# Patient Record
Sex: Female | Born: 1980 | Race: Black or African American | Hispanic: No | Marital: Married | Smoking: Never smoker
Health system: Southern US, Community
[De-identification: ages and names within clinical notes are randomized; demographics above are authoritative.]

## PROBLEM LIST (undated history)

## (undated) ENCOUNTER — Inpatient Hospital Stay (HOSPITAL_COMMUNITY): Payer: Self-pay

## (undated) DIAGNOSIS — E079 Disorder of thyroid, unspecified: Secondary | ICD-10-CM

## (undated) DIAGNOSIS — K439 Ventral hernia without obstruction or gangrene: Secondary | ICD-10-CM

## (undated) DIAGNOSIS — R0989 Other specified symptoms and signs involving the circulatory and respiratory systems: Secondary | ICD-10-CM

## (undated) HISTORY — PX: HERNIA REPAIR: SHX51

## (undated) HISTORY — DX: Disorder of thyroid, unspecified: E07.9

## (undated) HISTORY — DX: Ventral hernia without obstruction or gangrene: K43.9

---

## 1998-06-25 ENCOUNTER — Encounter: Payer: Self-pay | Admitting: Emergency Medicine

## 1998-06-25 ENCOUNTER — Emergency Department (HOSPITAL_COMMUNITY): Admission: EM | Admit: 1998-06-25 | Discharge: 1998-06-25 | Payer: Self-pay | Admitting: Emergency Medicine

## 1999-02-02 ENCOUNTER — Other Ambulatory Visit: Admission: RE | Admit: 1999-02-02 | Discharge: 1999-02-02 | Payer: Self-pay | Admitting: Obstetrics and Gynecology

## 1999-05-07 HISTORY — PX: WISDOM TOOTH EXTRACTION: SHX21

## 2004-05-06 HISTORY — PX: EYE SURGERY: SHX253

## 2007-06-17 ENCOUNTER — Observation Stay: Payer: Self-pay | Admitting: Obstetrics and Gynecology

## 2007-07-02 ENCOUNTER — Observation Stay: Payer: Self-pay | Admitting: Obstetrics and Gynecology

## 2007-07-10 ENCOUNTER — Ambulatory Visit: Payer: Self-pay | Admitting: Unknown Physician Specialty

## 2007-08-13 ENCOUNTER — Observation Stay: Payer: Self-pay

## 2007-08-29 ENCOUNTER — Inpatient Hospital Stay: Payer: Self-pay | Admitting: Unknown Physician Specialty

## 2008-02-29 ENCOUNTER — Ambulatory Visit: Payer: Self-pay | Admitting: Obstetrics and Gynecology

## 2008-08-17 ENCOUNTER — Ambulatory Visit: Payer: Self-pay | Admitting: Unknown Physician Specialty

## 2011-09-04 ENCOUNTER — Telehealth (INDEPENDENT_AMBULATORY_CARE_PROVIDER_SITE_OTHER): Payer: Self-pay | Admitting: Surgery

## 2011-09-04 ENCOUNTER — Ambulatory Visit (INDEPENDENT_AMBULATORY_CARE_PROVIDER_SITE_OTHER): Payer: BC Managed Care – PPO | Admitting: Surgery

## 2011-09-04 ENCOUNTER — Encounter (INDEPENDENT_AMBULATORY_CARE_PROVIDER_SITE_OTHER): Payer: Self-pay | Admitting: General Surgery

## 2011-09-04 ENCOUNTER — Encounter (INDEPENDENT_AMBULATORY_CARE_PROVIDER_SITE_OTHER): Payer: Self-pay | Admitting: Surgery

## 2011-09-04 VITALS — BP 124/68 | HR 68 | Temp 97.3°F | Resp 14 | Ht 65.0 in | Wt 178.4 lb

## 2011-09-04 DIAGNOSIS — K439 Ventral hernia without obstruction or gangrene: Secondary | ICD-10-CM

## 2011-09-04 HISTORY — DX: Ventral hernia without obstruction or gangrene: K43.9

## 2011-09-04 NOTE — Progress Notes (Signed)
Patient ID: Dana [name], [name]   DOB: 1980-11-12, 31 y.o.   MRN: [id number]  Chief Complaint  Patient presents with  . Hernia    HPI Dana [name] is a 31 y.o. [name].  Self-referred for evaluation of ventral hernia HPI This is a Dana 31 yo [name] who presents with a palpable mass above her umbilicus for the last couple of years.  In the last 6 months, this mass has become larger.  Over the weekend, it became quite large and very painful.  The pain improved when she was supine.  The mass has gone back down in size and is less uncomfortable.  She presents now for surgical evaluation.  Past Medical History  Diagnosis Date  . Hernia   . Chronic constipation   . Blood in stool   . Ventral hernia 09/04/2011    Past Surgical History  Procedure Date  . Wisdom tooth extraction 2001  . Eye surgery 2006    lasik - both eyes    Family History  Problem Relation Age of Onset  . Cancer Maternal Grandmother     lung    Social History History  Substance Use Topics  . Smoking status: Never Smoker   . Smokeless tobacco: Never Used  . Alcohol Use: No    No Known Allergies  Current Outpatient Prescriptions  Medication Sig Dispense Refill  . docusate sodium (COLACE) 100 MG capsule Take 100 mg by mouth 2 (two) times daily.      Marland Kitchen levocetirizine (XYZAL) 5 MG tablet 5 mg every evening.         Review of Systems Review of Systems  Constitutional: Negative for fever, chills and unexpected weight change.  HENT: Negative for hearing loss, congestion, sore throat, trouble swallowing and voice change.   Eyes: Negative for visual disturbance.  Respiratory: Negative for cough and wheezing.   Cardiovascular: Negative for chest pain, palpitations and leg swelling.  Gastrointestinal: Positive for abdominal pain and constipation. Negative for nausea, vomiting, diarrhea, blood in stool, abdominal distention and anal bleeding.  Genitourinary: Negative for hematuria, vaginal bleeding  and difficulty urinating.  Musculoskeletal: Negative for arthralgias.  Skin: Negative for rash and wound.  Neurological: Negative for seizures, syncope and headaches.  Hematological: Negative for adenopathy. Does not bruise/bleed easily.  Psychiatric/Behavioral: Negative for confusion.    Blood pressure 124/68, pulse 68, temperature 97.3 F (36.3 C), temperature source Temporal, resp. rate 14, height 5\' 5"  (1.651 m), weight 178 lb 6 oz (80.91 kg).  Physical Exam Physical Exam WDWN in NAD HEENT:  EOMI, sclera anicteric Neck:  No masses, no thyromegaly Lungs:  CTA bilaterally; normal respiratory effort CV:  Regular rate and rhythm; no murmurs Abd:  +bowel sounds, soft, palpable 4 cm mass above the umbilicus in the midline; partially reducible Ext:  Well-perfused; no edema Skin:  Warm, dry; no sign of jaundice  Data Reviewed none  Assessment    Ventral hernia - supraumbilical with probable incarcerated omentum    Plan    Ventral hernia repair with mesh.  The surgical procedure has been discussed with the patient.  Potential risks, benefits, alternative treatments, and expected outcomes have been explained.  All of the patient's questions at this time have been answered.  The likelihood of reaching the patient's treatment goal is good.  The patient understand the proposed surgical procedure and wishes to proceed.        Zaylin Pistilli K. 09/04/2011, 11:56 AM

## 2011-09-10 ENCOUNTER — Encounter (HOSPITAL_BASED_OUTPATIENT_CLINIC_OR_DEPARTMENT_OTHER): Payer: Self-pay | Admitting: *Deleted

## 2011-09-13 ENCOUNTER — Encounter (HOSPITAL_BASED_OUTPATIENT_CLINIC_OR_DEPARTMENT_OTHER): Payer: Self-pay | Admitting: Anesthesiology

## 2011-09-13 ENCOUNTER — Ambulatory Visit (HOSPITAL_BASED_OUTPATIENT_CLINIC_OR_DEPARTMENT_OTHER)
Admission: RE | Admit: 2011-09-13 | Discharge: 2011-09-13 | Disposition: A | Discharge status: Home / Self Care | Payer: BC Managed Care – PPO | Source: Ambulatory Visit | Attending: Surgery | Admitting: Surgery

## 2011-09-13 ENCOUNTER — Ambulatory Visit (HOSPITAL_BASED_OUTPATIENT_CLINIC_OR_DEPARTMENT_OTHER): Payer: BC Managed Care – PPO | Admitting: Anesthesiology

## 2011-09-13 ENCOUNTER — Encounter (HOSPITAL_BASED_OUTPATIENT_CLINIC_OR_DEPARTMENT_OTHER): Payer: Self-pay | Admitting: *Deleted

## 2011-09-13 ENCOUNTER — Encounter (HOSPITAL_BASED_OUTPATIENT_CLINIC_OR_DEPARTMENT_OTHER)
Admission: RE | Disposition: A | Discharge status: Home / Self Care | Payer: Self-pay | Source: Ambulatory Visit | Attending: Surgery

## 2011-09-13 DIAGNOSIS — K439 Ventral hernia without obstruction or gangrene: Secondary | ICD-10-CM

## 2011-09-13 HISTORY — PX: VENTRAL HERNIA REPAIR: SHX424

## 2011-09-13 LAB — POCT HEMOGLOBIN-HEMACUE: Hemoglobin: 13.5 g/dL (ref 12.0–15.0)

## 2011-09-13 SURGERY — REPAIR, HERNIA, VENTRAL
Anesthesia: General | Site: Abdomen | Wound class: Clean

## 2011-09-13 MED ORDER — BUPIVACAINE-EPINEPHRINE 0.25% -1:200000 IJ SOLN
INTRAMUSCULAR | Status: DC | PRN
  Administered 2011-09-13: 10 mL

## 2011-09-13 MED ORDER — PROPOFOL 10 MG/ML IV EMUL
INTRAVENOUS | Status: DC | PRN
  Administered 2011-09-13: 200 mg via INTRAVENOUS

## 2011-09-13 MED ORDER — CEFAZOLIN SODIUM-DEXTROSE 2-3 GM-% IV SOLR: 2.0000 g | INTRAVENOUS | Status: DC

## 2011-09-13 MED ORDER — KETOROLAC TROMETHAMINE 30 MG/ML IJ SOLN
30.0000 mg | Freq: Once | INTRAMUSCULAR | Status: AC
  Administered 2011-09-13: 30 mg via INTRAVENOUS

## 2011-09-13 MED ORDER — OXYCODONE-ACETAMINOPHEN 5-325 MG PO TABS: 1.0000 | ORAL_TABLET | ORAL | Status: AC | PRN

## 2011-09-13 MED ORDER — LIDOCAINE HCL (CARDIAC) 20 MG/ML IV SOLN
INTRAVENOUS | Status: DC | PRN
  Administered 2011-09-13: 80 mg via INTRAVENOUS

## 2011-09-13 MED ORDER — MORPHINE SULFATE 2 MG/ML IJ SOLN: 2.0000 mg | INTRAMUSCULAR | Status: DC | PRN

## 2011-09-13 MED ORDER — ONDANSETRON HCL 4 MG/2ML IJ SOLN: 4.0000 mg | Freq: Four times a day (QID) | INTRAMUSCULAR | Status: DC | PRN

## 2011-09-13 MED ORDER — HYDROMORPHONE HCL PF 1 MG/ML IJ SOLN
0.2500 mg | INTRAMUSCULAR | Status: DC | PRN
  Administered 2011-09-13: 0.5 mg via INTRAVENOUS

## 2011-09-13 MED ORDER — LACTATED RINGERS IV SOLN
INTRAVENOUS | Status: DC
  Administered 2011-09-13 (×2): via INTRAVENOUS

## 2011-09-13 MED ORDER — ONDANSETRON HCL 4 MG/2ML IJ SOLN
INTRAMUSCULAR | Status: DC | PRN
  Administered 2011-09-13: 4 mg via INTRAVENOUS

## 2011-09-13 MED ORDER — OXYCODONE-ACETAMINOPHEN 5-325 MG PO TABS: 1.0000 | ORAL_TABLET | ORAL | Status: DC | PRN

## 2011-09-13 MED ORDER — DEXAMETHASONE SODIUM PHOSPHATE 4 MG/ML IJ SOLN
INTRAMUSCULAR | Status: DC | PRN
  Administered 2011-09-13: 10 mg via INTRAVENOUS

## 2011-09-13 MED ORDER — ONDANSETRON HCL 4 MG/2ML IJ SOLN: 4.0000 mg | INTRAMUSCULAR | Status: DC | PRN

## 2011-09-13 MED ORDER — FENTANYL CITRATE 0.05 MG/ML IJ SOLN
INTRAMUSCULAR | Status: DC | PRN
  Administered 2011-09-13 (×2): 50 ug via INTRAVENOUS

## 2011-09-13 SURGICAL SUPPLY — 51 items
BENZOIN TINCTURE PRP APPL 2/3 (GAUZE/BANDAGES/DRESSINGS) ×2 IMPLANT
BLADE SURG 10 STRL SS (BLADE) ×2 IMPLANT
BLADE SURG 15 STRL LF DISP TIS (BLADE) ×1 IMPLANT
BLADE SURG 15 STRL SS (BLADE) ×1
BLADE SURG ROTATE 9660 (MISCELLANEOUS) ×2 IMPLANT
CANISTER SUCTION 1200CC (MISCELLANEOUS) ×2 IMPLANT
CHLORAPREP W/TINT 26ML (MISCELLANEOUS) ×2 IMPLANT
CLEANER CAUTERY TIP 5X5 PAD (MISCELLANEOUS) ×1 IMPLANT
CLOTH BEACON ORANGE TIMEOUT ST (SAFETY) ×2 IMPLANT
COVER MAYO STAND STRL (DRAPES) ×2 IMPLANT
COVER TABLE BACK 60X90 (DRAPES) ×2 IMPLANT
DECANTER SPIKE VIAL GLASS SM (MISCELLANEOUS) ×2 IMPLANT
DRAPE LAPAROTOMY T 102X78X121 (DRAPES) ×2 IMPLANT
DRAPE UTILITY XL STRL (DRAPES) ×2 IMPLANT
DRSG TEGADERM 4X4.75 (GAUZE/BANDAGES/DRESSINGS) ×2 IMPLANT
ELECT REM PT RETURN 9FT ADLT (ELECTROSURGICAL) ×2
ELECTRODE REM PT RTRN 9FT ADLT (ELECTROSURGICAL) ×1 IMPLANT
GAUZE SPONGE 4X4 12PLY STRL LF (GAUZE/BANDAGES/DRESSINGS) ×4 IMPLANT
GLOVE BIO SURGEON STRL SZ 6.5 (GLOVE) ×2 IMPLANT
GLOVE BIO SURGEON STRL SZ7 (GLOVE) ×2 IMPLANT
GLOVE BIOGEL PI IND STRL 7.5 (GLOVE) ×1 IMPLANT
GLOVE BIOGEL PI INDICATOR 7.5 (GLOVE) ×1
GOWN PREVENTION PLUS XLARGE (GOWN DISPOSABLE) ×4 IMPLANT
NEEDLE HYPO 25X1 1.5 SAFETY (NEEDLE) ×2 IMPLANT
NEEDLE SPNL 25GX3.5 QUINCKE BL (NEEDLE) IMPLANT
NS IRRIG 1000ML POUR BTL (IV SOLUTION) ×2 IMPLANT
PACK BASIN DAY SURGERY FS (CUSTOM PROCEDURE TRAY) ×2 IMPLANT
PAD CLEANER CAUTERY TIP 5X5 (MISCELLANEOUS) ×1
PATCH VENTRAL SMALL 4.3 (Mesh Specialty) ×2 IMPLANT
PENCIL BUTTON HOLSTER BLD 10FT (ELECTRODE) ×2 IMPLANT
SLEEVE SCD COMPRESS KNEE MED (MISCELLANEOUS) ×2 IMPLANT
SPONGE LAP 18X18 X RAY DECT (DISPOSABLE) ×2 IMPLANT
STAPLER VISISTAT 35W (STAPLE) IMPLANT
STRIP CLOSURE SKIN 1/2X4 (GAUZE/BANDAGES/DRESSINGS) ×2 IMPLANT
SUT MNCRL AB 4-0 PS2 18 (SUTURE) ×2 IMPLANT
SUT NOVA NAB DX-16 0-1 5-0 T12 (SUTURE) ×2 IMPLANT
SUT NOVA NAB GS-21 0 18 T12 DT (SUTURE) ×2 IMPLANT
SUT PROLENE 0 CT 1 CR/8 (SUTURE) IMPLANT
SUT PROLENE 2 0 CT2 30 (SUTURE) IMPLANT
SUT SILK 2 0 TIES 17X18 (SUTURE)
SUT SILK 2-0 18XBRD TIE BLK (SUTURE) IMPLANT
SUT SURG 0 T 19/GS 22 1969 62 (SUTURE) IMPLANT
SUT VIC AB 3-0 SH 27 (SUTURE) ×1
SUT VIC AB 3-0 SH 27X BRD (SUTURE) ×1 IMPLANT
SYR BULB 3OZ (MISCELLANEOUS) IMPLANT
SYR CONTROL 10ML LL (SYRINGE) ×2 IMPLANT
TOWEL OR 17X24 6PK STRL BLUE (TOWEL DISPOSABLE) ×2 IMPLANT
TOWEL OR NON WOVEN STRL DISP B (DISPOSABLE) ×2 IMPLANT
TUBE CONNECTING 20X1/4 (TUBING) ×2 IMPLANT
WATER STERILE IRR 1000ML POUR (IV SOLUTION) IMPLANT
YANKAUER SUCT BULB TIP NO VENT (SUCTIONS) ×2 IMPLANT

## 2011-09-13 NOTE — Interval H&P Note (Signed)
History and Physical Interval Note:  09/13/2011 7:24 AM  Dana [name]  has presented today for surgery, with the diagnosis of ventral hernia  The various methods of treatment have been discussed with the patient and family. After consideration of risks, benefits and other options for treatment, the patient has consented to  Procedure(s) (LRB): HERNIA REPAIR VENTRAL ADULT (N/A) INSERTION OF MESH (N/A) as a surgical intervention .  The patients' history has been reviewed, patient examined, no change in status, stable for surgery.  I have reviewed the patients' chart and labs.  Questions were answered to the patient's satisfaction.     [name]

## 2011-09-13 NOTE — Anesthesia Preprocedure Evaluation (Signed)
Anesthesia Evaluation  Patient identified by MRN, date of birth, ID band Patient awake    Reviewed: Allergy & Precautions, H&P , NPO status , Patient's Chart, lab work & pertinent test results  Airway Mallampati: II  Neck ROM: full    Dental   Pulmonary          Cardiovascular     Neuro/Psych    GI/Hepatic   Endo/Other    Renal/GU      Musculoskeletal   Abdominal   Peds  Hematology   Anesthesia Other Findings   Reproductive/Obstetrics                           Anesthesia Physical Anesthesia Plan  ASA: I  Anesthesia Plan: General   Post-op Pain Management:    Induction: Intravenous  Airway Management Planned: Oral ETT  Additional Equipment:   Intra-op Plan:   Post-operative Plan: Extubation in OR  Informed Consent: I have reviewed the patients History and Physical, chart, labs and discussed the procedure including the risks, benefits and alternatives for the proposed anesthesia with the patient or authorized representative who has indicated his/her understanding and acceptance.     Plan Discussed with: CRNA and Surgeon  Anesthesia Plan Comments:         Anesthesia Quick Evaluation  

## 2011-09-13 NOTE — Op Note (Signed)
Indications:  The patient presented with a history of a supraumbilical ventral hernia.  The patient was examined and we recommended ventrall hernia repair with mesh.  Pre-operative diagnosis: Ventral hernia  Post-operative diagnosis:  Same  Surgeon: Melchizedek Espinola K.   Assistants: none  Anesthesia: General LMA anesthesia  ASA Class: 2   Procedure Details  The patient was seen again in the Holding Room. The risks, benefits, complications, treatment options, and expected outcomes were discussed with the patient. The possibilities of reaction to medication, pulmonary aspiration, perforation of viscus, bleeding, recurrent infection, the need for additional procedures, and development of a complication requiring transfusion or further operation were discussed with the patient and/or family. There was concurrence with the proposed plan, and informed consent was obtained. The site of surgery was properly noted/marked. The patient was taken to the Operating Room, identified as Dana [name], and the procedure verified as umbilical hernia repair. A Time Out was held and the above information confirmed.  After an adequate level of general anesthesia was obtained, the patient's abdomen was prepped with Chloraprep and draped in sterile fashion.  We made a vertical incision about 4 cm long above the umbilicus.  Dissection was carried down to the hernia sac with cautery.  We dissected bluntly around the hernia sac down to the edge of the fascial defect.  We reduced the hernia sac back into the pre-peritoneal space.  The fascial defect measured 1.5 cm across.  We cleared the fascia in all directions.  A small Proceed ventral patch was inserted into the pre-peritoneal space and was deployed.  The mesh was secured with four trans-fascial sutures of 0 Novofil.  The fascial defect was closed with multiple interrupted figure-of-eight 1 Novofil sutures.  3-0 Vicryl was used to close the subcutaneous tissues and  4-0 Monocryl was used to close the skin.  Steri-strips and clean dressing were applied.  The patient was extubated and brought to the recovery room in stable condition.  All sponge, instrument, and needle counts were correct prior to closure and at the conclusion of the case.   Estimated Blood Loss: Minimal          Complications: None; patient tolerated the procedure well.         Disposition: PACU - hemodynamically stable.         Condition: stable  [name]. [name], MD, [facility]  09/13/2011 8:33 AM

## 2011-09-13 NOTE — Anesthesia Postprocedure Evaluation (Signed)
Anesthesia Post Note  Patient: Firefighter  Procedure(s) Performed: Procedure(s) (LRB): HERNIA REPAIR VENTRAL ADULT (N/A) INSERTION OF MESH (N/A)  Anesthesia type: General  Patient location: PACU  Post pain: Pain level controlled and Adequate analgesia  Post assessment: Post-op Vital signs reviewed, Patient's Cardiovascular Status Stable, Respiratory Function Stable, Patent Airway and Pain level controlled  Last Vitals:  Filed Vitals:   09/13/11 0915  BP: 116/78  Pulse: 92  Temp:   Resp: 23    Post vital signs: Reviewed and stable  Level of consciousness: awake, alert  and oriented  Complications: No apparent anesthesia complications

## 2011-09-13 NOTE — Discharge Instructions (Signed)
[facility], [place]  VENTRAL HERNIA REPAIR: POST OP INSTRUCTIONS  Always review your discharge instruction sheet given to you by the facility where your surgery was performed. IF YOU HAVE DISABILITY OR FAMILY LEAVE FORMS, YOU MUST BRING THEM TO THE OFFICE FOR PROCESSING.   DO NOT GIVE THEM TO YOUR DOCTOR.  1. A  prescription for pain medication may be given to you upon discharge.  Take your pain medication as prescribed, if needed.  If narcotic pain medicine is not needed, then you may take acetaminophen (Tylenol) or ibuprofen (Advil) as needed. 2. Take your usually prescribed medications unless otherwise directed. 3. If you need a refill on your pain medication, please contact your pharmacy.  They will contact our office to request authorization. Prescriptions will not be filled after 5 pm or on week-ends. 4. You should follow a light diet the first 24 hours after arrival home, such as soup and crackers, etc.  Be sure to include lots of fluids daily.  Resume your normal diet the day after surgery. 5. Most patients will experience some swelling and bruising around the incision.  Ice packs and reclining will help.  Swelling and bruising can take several days to resolve.  6. It is common to experience some constipation if taking pain medication after surgery.  Increasing fluid intake and taking a stool softener (such as Colace) will usually help or prevent this problem from occurring.  A mild laxative (Milk of Magnesia or Miralax) should be taken according to package directions if there are no bowel movements after 48 hours. 7. Unless discharge instructions indicate otherwise, you may remove your bandages 24-48 hours after surgery, and you may shower at that time.  You will have steri-strips (small skin tapes) in place directly over the incision.  These strips should be left on the skin for 7-10 days. 8. ACTIVITIES:  You may resume regular (light) daily activities beginning the next day--such  as daily self-care, walking, climbing stairs--gradually increasing activities as tolerated.  You may have sexual intercourse when it is comfortable.  Refrain from any heavy lifting or straining until approved by your doctor. a. You may drive when you are no longer taking prescription pain medication, you can comfortably wear a seatbelt, and you can safely maneuver your car and apply brakes. b. RETURN TO WORK:  2-3 weeks with light duty - no lifting over 15 lbs. 9. You should see your doctor in the office for a follow-up appointment approximately 2-3 weeks after your surgery.  Make sure that you call for this appointment within a day or two after you arrive home to insure a convenient appointment time. 10. OTHER INSTRUCTIONS:  __________________________________________________________________________________________________________________________________________________________________________________________  WHEN TO CALL YOUR DOCTOR: 1. Fever over 101.0 2. Inability to urinate 3. Nausea and/or vomiting 4. Extreme swelling or bruising 5. Continued bleeding from incision. 6. Increased pain, redness, or drainage from the incision  The clinic staff is available to answer your questions during regular business hours.  Please don't hesitate to call and ask to speak to one of the nurses for clinical concerns.  If you have a medical emergency, go to the nearest emergency room or call 911.  A surgeon from [facility] is always on call at the hospital   [address], [place], [place]  [address] ?  [address] [address], [place], [place]   [address] [phone]    FAX [phone] Web site: [email or web address]   Post Anesthesia Home Care  Instructions  Activity: Get plenty of rest for the remainder of the day. A responsible adult should stay with you for 24 hours following the procedure.  For the next 24 hours, DO NOT: -Drive a car -Social worker -Drink alcoholic beverages -Take any medication unless instructed by your physician -Make any legal decisions or sign important papers.  Meals: Start with liquid foods such as gelatin or soup. Progress to regular foods as tolerated. Avoid greasy, spicy, heavy foods. If nausea and/or vomiting occur, drink only clear liquids until the nausea and/or vomiting subsides. Call your physician if vomiting continues.  Special Instructions/Symptoms: Your throat may feel dry or sore from the anesthesia or the breathing tube placed in your throat during surgery. If this causes discomfort, gargle with warm salt water. The discomfort should disappear within 24 hours.

## 2011-09-13 NOTE — H&P (View-Only) (Signed)
Patient ID: Dana [name], female   DOB: 12/03/1980, 31 y.o.   MRN: [id number]  Chief Complaint  Patient presents with  . Hernia    HPI Dana [name] is a 31 y.o. female.  Self-referred for evaluation of ventral hernia HPI This is a health 31 yo female who presents with a palpable mass above her umbilicus for the last couple of years.  In the last 6 months, this mass has become larger.  Over the weekend, it became quite large and very painful.  The pain improved when she was supine.  The mass has gone back down in size and is less uncomfortable.  She presents now for surgical evaluation.  Past Medical History  Diagnosis Date  . Hernia   . Chronic constipation   . Blood in stool   . Ventral hernia 09/04/2011    Past Surgical History  Procedure Date  . Wisdom tooth extraction 2001  . Eye surgery 2006    lasik - both eyes    Family History  Problem Relation Age of Onset  . Cancer Maternal Grandmother     lung    Social History History  Substance Use Topics  . Smoking status: Never Smoker   . Smokeless tobacco: Never Used  . Alcohol Use: No    No Known Allergies  Current Outpatient Prescriptions  Medication Sig Dispense Refill  . docusate sodium (COLACE) 100 MG capsule Take 100 mg by mouth 2 (two) times daily.      . levocetirizine (XYZAL) 5 MG tablet 5 mg every evening.         Review of Systems Review of Systems  Constitutional: Negative for fever, chills and unexpected weight change.  HENT: Negative for hearing loss, congestion, sore throat, trouble swallowing and voice change.   Eyes: Negative for visual disturbance.  Respiratory: Negative for cough and wheezing.   Cardiovascular: Negative for chest pain, palpitations and leg swelling.  Gastrointestinal: Positive for abdominal pain and constipation. Negative for nausea, vomiting, diarrhea, blood in stool, abdominal distention and anal bleeding.  Genitourinary: Negative for hematuria, vaginal bleeding  and difficulty urinating.  Musculoskeletal: Negative for arthralgias.  Skin: Negative for rash and wound.  Neurological: Negative for seizures, syncope and headaches.  Hematological: Negative for adenopathy. Does not bruise/bleed easily.  Psychiatric/Behavioral: Negative for confusion.    Blood pressure 124/68, pulse 68, temperature 97.3 F (36.3 C), temperature source Temporal, resp. rate 14, height 5' 5" (1.651 m), weight 178 lb 6 oz (80.91 kg).  Physical Exam Physical Exam WDWN in NAD HEENT:  EOMI, sclera anicteric Neck:  No masses, no thyromegaly Lungs:  CTA bilaterally; normal respiratory effort CV:  Regular rate and rhythm; no murmurs Abd:  +bowel sounds, soft, palpable 4 cm mass above the umbilicus in the midline; partially reducible Ext:  Well-perfused; no edema Skin:  Warm, dry; no sign of jaundice  Data Reviewed none  Assessment    Ventral hernia - supraumbilical with probable incarcerated omentum    Plan    Ventral hernia repair with mesh.  The surgical procedure has been discussed with the patient.  Potential risks, benefits, alternative treatments, and expected outcomes have been explained.  All of the patient's questions at this time have been answered.  The likelihood of reaching the patient's treatment goal is good.  The patient understand the proposed surgical procedure and wishes to proceed.        Tyler Cubit K. 09/04/2011, 11:56 AM    

## 2011-09-13 NOTE — Transfer of Care (Signed)
Immediate Anesthesia Transfer of Care Note  Patient: Dana [name]  Procedure(s) Performed: Procedure(s) (LRB): HERNIA REPAIR VENTRAL ADULT (N/A) INSERTION OF MESH (N/A)  Patient Location: PACU  Anesthesia Type: General  Level of Consciousness: sedated  Airway & Oxygen Therapy: Patient Spontanous Breathing and Patient connected to face mask oxygen  Post-op Assessment: Report given to PACU RN and Post -op Vital signs reviewed and stable  Post vital signs: Reviewed and stable  Complications: No apparent anesthesia complications

## 2011-09-13 NOTE — Anesthesia Procedure Notes (Signed)
Procedure Name: LMA Insertion Date/Time: 09/13/2011 7:45 AM Performed by: Burna Cash Pre-anesthesia Checklist: Patient identified, Emergency Drugs available, Suction available and Patient being monitored Patient Re-evaluated:Patient Re-evaluated prior to inductionOxygen Delivery Method: Circle System Utilized Preoxygenation: Pre-oxygenation with 100% oxygen Intubation Type: IV induction Ventilation: Mask ventilation without difficulty LMA: LMA inserted LMA Size: 4.0 Number of attempts: 1 Airway Equipment and Method: bite block Placement Confirmation: positive ETCO2 Tube secured with: Tape Dental Injury: Teeth and Oropharynx as per pre-operative assessment

## 2011-09-13 NOTE — Addendum Note (Signed)
Addendum  created 09/13/11 1226 by Burna Cash, CRNA   Modules edited:Anesthesia Events

## 2011-09-16 ENCOUNTER — Encounter (HOSPITAL_BASED_OUTPATIENT_CLINIC_OR_DEPARTMENT_OTHER): Payer: Self-pay | Admitting: Surgery

## 2011-09-18 ENCOUNTER — Telehealth (INDEPENDENT_AMBULATORY_CARE_PROVIDER_SITE_OTHER): Payer: Self-pay | Admitting: General Surgery

## 2011-09-18 NOTE — Telephone Encounter (Signed)
Patient works for [employer] at [employer] job. Calling post hernia repair inquiring about a return to work date. She states her forms say 09/27/11 which would be 2 weeks. She was asking if she could go back sooner. 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 She will bring her forms here to be filled out. I advised to write a note on them to let our employee who fills these forms out know of her intended return to work date. She will call with any other questions.

## 2011-09-27 ENCOUNTER — Encounter (INDEPENDENT_AMBULATORY_CARE_PROVIDER_SITE_OTHER): Payer: Self-pay | Admitting: Surgery

## 2011-09-27 ENCOUNTER — Ambulatory Visit (INDEPENDENT_AMBULATORY_CARE_PROVIDER_SITE_OTHER): Payer: BC Managed Care – PPO | Admitting: Surgery

## 2011-09-27 VITALS — BP 100/80 | HR 80 | Temp 98.3°F | Resp 14 | Ht 65.0 in | Wt 179.8 lb

## 2011-09-27 DIAGNOSIS — K439 Ventral hernia without obstruction or gangrene: Secondary | ICD-10-CM

## 2011-09-27 NOTE — Progress Notes (Signed)
S/p open ventral hernia repair with mesh on 09/13/11 for a 1.5 cm supraumbilical ventral hernia. We repaired this hernia with a small proceed ventral patch. The patient reports no pain. Her incision is well healed with no sign of infection. No sign of recurrent hernia. No seroma. Steri-Strips are removed.  The patient may begin increasing her level of activity. Followup p.r.n.  [name]. [name], MD, [facility]  09/27/2011 5:45 PM

## 2013-07-20 ENCOUNTER — Ambulatory Visit (INDEPENDENT_AMBULATORY_CARE_PROVIDER_SITE_OTHER): Payer: BC Managed Care – PPO | Admitting: Family Medicine

## 2013-07-20 VITALS — BP 110/76 | HR 92 | Temp 98.3°F | Resp 16 | Ht 65.0 in | Wt 191.8 lb

## 2013-07-20 DIAGNOSIS — N390 Urinary tract infection, site not specified: Secondary | ICD-10-CM

## 2013-07-20 DIAGNOSIS — R350 Frequency of micturition: Secondary | ICD-10-CM

## 2013-07-20 LAB — POCT URINE PREGNANCY: Preg Test, Ur: NEGATIVE

## 2013-07-20 LAB — POCT URINALYSIS DIPSTICK
Bilirubin, UA: NEGATIVE
Glucose, UA: NEGATIVE
Ketones, UA: 15
Nitrite, UA: NEGATIVE
Protein, UA: 100
Spec Grav, UA: 1.025
Urobilinogen, UA: 1
pH, UA: 6.5

## 2013-07-20 LAB — POCT UA - MICROSCOPIC ONLY
Casts, Ur, LPF, POC: NEGATIVE
Crystals, Ur, HPF, POC: NEGATIVE
Yeast, UA: NEGATIVE

## 2013-07-20 MED ORDER — NITROFURANTOIN MONOHYD MACRO 100 MG PO CAPS: 100.0000 mg | ORAL_CAPSULE | Freq: Two times a day (BID) | ORAL | Status: DC

## 2013-07-20 NOTE — Patient Instructions (Signed)
Urinary Tract Infection  Urinary tract infections (UTIs) can develop anywhere along your urinary tract. Your urinary tract is your body's drainage system for removing wastes and extra water. Your urinary tract includes two kidneys, two ureters, a bladder, and a urethra. Your kidneys are a pair of bean-shaped organs. Each kidney is about the size of your fist. They are located below your ribs, one on each side of your spine.  CAUSES  Infections are caused by microbes, which are microscopic organisms, including fungi, viruses, and bacteria. These organisms are so small that they can only be seen through a microscope. Bacteria are the microbes that most commonly cause UTIs.  SYMPTOMS   Symptoms of UTIs may vary by age and gender of the patient and by the location of the infection. Symptoms in young women typically include a frequent and intense urge to urinate and a painful, burning feeling in the bladder or urethra during urination. Older women and men are more likely to be tired, shaky, and weak and have muscle aches and abdominal pain. A fever may mean the infection is in your kidneys. Other symptoms of a kidney infection include pain in your back or sides below the ribs, nausea, and vomiting.  DIAGNOSIS  To diagnose a UTI, your caregiver will ask you about your symptoms. Your caregiver also will ask to provide a urine sample. The urine sample will be tested for bacteria and white blood cells. White blood cells are made by your body to help fight infection.  TREATMENT   Typically, UTIs can be treated with medication. Because most UTIs are caused by a bacterial infection, they usually can be treated with the use of antibiotics. The choice of antibiotic and length of treatment depend on your symptoms and the type of bacteria causing your infection.  HOME CARE INSTRUCTIONS   If you were prescribed antibiotics, take them exactly as your caregiver instructs you. Finish the medication even if you feel better after you  have only taken some of the medication.   Drink enough water and fluids to keep your urine clear or pale yellow.   Avoid caffeine, tea, and carbonated beverages. They tend to irritate your bladder.   Empty your bladder often. Avoid holding urine for long periods of time.   Empty your bladder before and after sexual intercourse.   After a bowel movement, women should cleanse from front to back. Use each tissue only once.  SEEK MEDICAL CARE IF:    You have back pain.   You develop a fever.   Your symptoms do not begin to resolve within 3 days.  SEEK IMMEDIATE MEDICAL CARE IF:    You have severe back pain or lower abdominal pain.   You develop chills.   You have nausea or vomiting.   You have continued burning or discomfort with urination.  MAKE SURE YOU:    Understand these instructions.   Will watch your condition.   Will get help right away if you are not doing well or get worse.  Document Released: 01/30/2005 Document Revised: 10/22/2011 Document Reviewed: 05/31/2011  [employer] Patient Information 2014 [employer]

## 2013-07-20 NOTE — Progress Notes (Signed)
Subjective:    Patient ID: Dana [name], female    DOB: 21-Mar-1981, 33 y.o.   MRN: [id number] This chart was scribed for Robyn Haber, MD by Anastasia Pall, ED Scribe. This patient was seen in room 11 and the patient's care was started at 2:11 PM.  Chief Complaint  Patient presents with   Increased frequency of urination    X yesterday   Discomfort with Urination    X yesterday   HPI Dana [name] is a 33 y.o. female Pt presents with urinary frequency, onset yesterday, with associated dysuria and abdominal cramping. She states her urine has had a strong odor to it today. She reports h/o UTI with her last pregnancy. She states her LNMP was beginning of 07/2013. She denies fever, back pain, discharge, and any other associated symptoms. Patient is in the process of trying to conceive.  PCP Dana Carls, MD  Patient Active Problem List   Diagnosis Date Noted   Ventral hernia 09/04/2011   Prior to Admission medications   Medication Sig Start Date End Date Taking? Authorizing Provider  Prenatal Vit-Fe Fumarate-FA (PRENATAL MULTIVITAMIN) TABS tablet Take 1 tablet by mouth daily at 12 noon.   Yes Historical Provider, MD  docusate sodium (COLACE) 100 MG capsule Take 100 mg by mouth daily.     Historical Provider, MD  levocetirizine (XYZAL) 5 MG tablet 5 mg every evening.  09/03/11   Historical Provider, MD   Review of Systems  Constitutional: Negative for fever.  Gastrointestinal: Positive for abdominal pain (cramping).  Genitourinary: Positive for dysuria and frequency. Negative for vaginal discharge.       + for strong urine odor  Musculoskeletal: Negative for back pain.    BP 110/76   Pulse 92   Temp(Src) 98.3 F (36.8 C) (Oral)   Resp 16   Ht 5\' 5"  (1.651 m)   Wt 191 lb 12.8 oz (87 kg)   BMI 31.92 kg/m2   SpO2 97%   LMP 07/04/2013    Objective:   Physical Exam Nursing note and vitals reviewed. Constitutional: Pt is oriented to person, place, and time. Pt appears  well-developed and well-nourished. No distress.  HENT: Right external ear nl. Left external ear nl.  Head: Normocephalic and atraumatic.  Eyes: EOM are normal.  Neck: Neck supple.  Cardiovascular: Nl rate.  Pulmonary/Chest: Effort normal. No respiratory distress.  Abdominal:  Musculoskeletal: Normal range of motion. No tenderness. No edema.   Neurological: Pt is alert and oriented to person, place, and time.  Skin: Skin is warm and dry.  Psychiatric: Pt has a normal mood and affect. Pt's behavior is normal.  Results for orders placed in visit on 07/20/13  POCT UA - MICROSCOPIC ONLY      Result Value Ref Range   WBC, Ur, HPF, POC tntc     RBC, urine, microscopic tntc     Bacteria, U Microscopic 2+     Mucus, UA trace     Epithelial cells, urine per micros 1-3     Crystals, Ur, HPF, POC neg     Casts, Ur, LPF, POC neg     Yeast, UA neg    POCT URINALYSIS DIPSTICK      Result Value Ref Range   Color, UA yellow     Clarity, UA cloudy     Glucose, UA neg     Bilirubin, UA neg     Ketones, UA 15     Spec Grav, UA 1.025  Blood, UA large     pH, UA 6.5     Protein, UA 100     Urobilinogen, UA 1.0     Nitrite, UA neg     Leukocytes, UA moderate (2+)         Assessment & Plan:  Urinary tract infection Increased frequency of urination - Plan: POCT UA - Microscopic Only, POCT urinalysis dipstick, POCT urine pregnancy, Urine culture macrobid 100 bid x 7 days Signed, Robyn Haber, MD

## 2013-07-22 LAB — URINE CULTURE: Colony Count: >=100000

## 2013-10-14 LAB — OB RESULTS CONSOLE RPR: RPR: NONREACTIVE

## 2013-10-14 LAB — OB RESULTS CONSOLE HEPATITIS B SURFACE ANTIGEN: HEP B S AG: NEGATIVE

## 2013-10-14 LAB — OB RESULTS CONSOLE HIV ANTIBODY (ROUTINE TESTING): HIV: NONREACTIVE

## 2013-10-14 LAB — OB RESULTS CONSOLE RUBELLA ANTIBODY, IGM: RUBELLA: IMMUNE

## 2013-10-14 LAB — OB RESULTS CONSOLE GC/CHLAMYDIA
CHLAMYDIA, DNA PROBE: NEGATIVE
GC PROBE AMP, GENITAL: NEGATIVE

## 2013-10-14 LAB — OB RESULTS CONSOLE ABO/RH: RH TYPE: POSITIVE

## 2013-10-14 LAB — OB RESULTS CONSOLE ANTIBODY SCREEN: Antibody Screen: NEGATIVE

## 2013-11-01 ENCOUNTER — Inpatient Hospital Stay (HOSPITAL_COMMUNITY): Payer: BC Managed Care – PPO

## 2013-11-01 ENCOUNTER — Encounter (HOSPITAL_COMMUNITY): Payer: Self-pay

## 2013-11-01 ENCOUNTER — Inpatient Hospital Stay (HOSPITAL_COMMUNITY)
Admission: AD | Admit: 2013-11-01 | Discharge: 2013-11-01 | Disposition: A | Discharge status: Home / Self Care | Payer: BC Managed Care – PPO | Source: Ambulatory Visit | Attending: Obstetrics and Gynecology | Admitting: Obstetrics and Gynecology

## 2013-11-01 DIAGNOSIS — O3410 Maternal care for benign tumor of corpus uteri, unspecified trimester: Secondary | ICD-10-CM | POA: Insufficient documentation

## 2013-11-01 DIAGNOSIS — O208 Other hemorrhage in early pregnancy: Secondary | ICD-10-CM

## 2013-11-01 DIAGNOSIS — O468X1 Other antepartum hemorrhage, first trimester: Secondary | ICD-10-CM

## 2013-11-01 DIAGNOSIS — D252 Subserosal leiomyoma of uterus: Secondary | ICD-10-CM

## 2013-11-01 DIAGNOSIS — O418X10 Other specified disorders of amniotic fluid and membranes, first trimester, not applicable or unspecified: Secondary | ICD-10-CM

## 2013-11-01 LAB — HCG, QUANTITATIVE, PREGNANCY: HCG, BETA CHAIN, QUANT, S: 70071 m[IU]/mL — AB (ref <5)

## 2013-11-01 LAB — CBC
HCT: 36.4 % (ref 36.0–46.0)
Hemoglobin: 12.1 g/dL (ref 12.0–15.0)
MCH: 29.4 pg (ref 26.0–34.0)
MCHC: 33.2 g/dL (ref 30.0–36.0)
MCV: 88.6 fL (ref 78.0–100.0)
PLATELETS: 180 10*3/uL (ref 150–400)
RBC: 4.11 MIL/uL (ref 3.87–5.11)
RDW: 13.4 % (ref 11.5–15.5)
WBC: 7.1 10*3/uL (ref 4.0–10.5)

## 2013-11-01 NOTE — MAU Provider Note (Signed)
History  33 yo G2P1 presents to MAU unannounced w/ c/o on and off brown spotting since her 5 week U/S. Scheduled for viability U/S on 7-2. Last night was bright red discharge. Discharge is brown this am. Denies pain, N/V, syncope, fever, or SOB. Blood type AB+.  10/12/13: Quant 4265.6 10/13/13: U/S (in office); [redacted]w[redacted]d -- +yolk sac, no fetal pole seen.   Patient Active Problem List   Diagnosis Date Noted  . Fibroids, subserous - measuring 2x2 cm in left anterior corpus 11/01/2013  . Subchorionic hemorrhage in first trimester 11/01/2013  . Ventral hernia 09/04/2011    Chief Complaint  Patient presents with  . Vaginal Bleeding   HPI See above  OB History   Grav Para Term Preterm Abortions TAB SAB Ect Mult Living   2 1 1       1       Past Medical History  Diagnosis Date  . Ventral hernia 09/04/2011  . Thyroid disease     Past Surgical History  Procedure Laterality Date  . Wisdom tooth extraction  2001  . Eye surgery  2006    lasik - both eyes  . Ventral hernia repair  09/13/2011    Procedure: HERNIA REPAIR VENTRAL ADULT;  Surgeon: Imogene Burn. Georgette Dover, MD;  Location: [facility];  Service: General;  Laterality: N/A;  . Hernia repair      Family History  Problem Relation Age of Onset  . Cancer Maternal Grandmother     lung  . Hypertension Maternal Grandmother   . Hypertension Mother   . Heart disease Maternal Grandfather     History  Substance Use Topics  . Smoking status: Never Smoker   . Smokeless tobacco: Never Used  . Alcohol Use: No    Allergies: No Known Allergies  No prescriptions prior to admission    ROS Spotting    Blood pressure 115/76, pulse 82, temperature 99.2 F (37.3 C), temperature source Oral, resp. rate 16, height 5\' 5"  (1.651 m), weight 199 lb 12.8 oz (90.629 kg), last menstrual period 08/29/2013, SpO2 100.00%.  Physical Exam Gen: Mild distress Lungs: CTAB CV: RRR Abd: soft, non-tender, no rebound or guarding Pelvic: Old  blood in vault. Cervix appears red/inflamed, yet closed Ext: WNL  Recent Results (from the past 2160 hour(s))  HCG, QUANTITATIVE, PREGNANCY     Status: Abnormal   Collection Time    11/01/13  9:25 AM      Result Value Ref Range   hCG, Beta Chain, Quant, S 70071 (*) <5 mIU/mL   Comment:              GEST. AGE      CONC.  (mIU/mL)       <=1 WEEK        5 - 50         2 WEEKS       50 - 500         3 WEEKS       100 - 10,000         4 WEEKS     1,000 - 30,000         5 WEEKS     3,500 - 115,000       6-8 WEEKS     12,000 - 270,000        12 WEEKS     15,000 - 220,000                FEMALE AND NON-PREGNANT  FEMALE:         LESS THAN 5 mIU/mL  CBC     Status: None   Collection Time    11/01/13  9:25 AM      Result Value Ref Range   WBC 7.1  4.0 - 10.5 K/uL   RBC 4.11  3.87 - 5.11 MIL/uL   Hemoglobin 12.1  12.0 - 15.0 g/dL   HCT 36.4  36.0 - 46.0 %   MCV 88.6  78.0 - 100.0 fL   MCH 29.4  26.0 - 34.0 pg   MCHC 33.2  30.0 - 36.0 g/dL   RDW 13.4  11.5 - 15.5 %   Platelets 180  150 - 400 K/uL    ED Course  Assessment: Early gestation w/ spotting U/S today reveals SIUP @ [redacted]w[redacted]d w/ EDD 06/12/14 w/ small subchorionic hemorrhage and 2x2 subserosal fibroid in the left anterior corpus. No active bleeding Normal CBC  Plan: Reviewed u/s results Pelvic rest Bleeding precautions Keep office appt   Farrel Gordon CNM, MS 11/01/13@ 09:34 AM

## 2013-11-01 NOTE — MAU Note (Signed)
Patient states she has had on and off brown spotting since her 5 week U/S. Scheduled for a U/S on 7-2. Last night was bright red discharge. Discharge is brown this am. Denies any pain.

## 2013-11-01 NOTE — Discharge Instructions (Signed)
Vaginal Bleeding During Pregnancy, First Trimester  A small amount of bleeding (spotting) from the vagina is relatively common in early pregnancy. It usually stops on its own. Various things may cause bleeding or spotting in early pregnancy. Some bleeding may be related to the pregnancy, and some may not. In most cases, the bleeding is normal and is not a problem. However, bleeding can also be a sign of something serious. Be sure to tell your health care provider about any vaginal bleeding right away.  Some possible causes of vaginal bleeding during the first trimester include:  · Infection or inflammation of the cervix.  · Growths (polyps) on the cervix.  · Miscarriage or threatened miscarriage.  · Pregnancy tissue has developed outside of the uterus and in a fallopian tube (tubal pregnancy).  · Tiny cysts have developed in the uterus instead of pregnancy tissue (molar pregnancy).  HOME CARE INSTRUCTIONS   Watch your condition for any changes. The following actions may help to lessen any discomfort you are feeling:  · Follow your health care provider's instructions for limiting your activity. If your health care provider orders bed rest, you may need to stay in bed and only get up to use the bathroom. However, your health care provider may allow you to continue light activity.  · If needed, make plans for someone to help with your regular activities and responsibilities while you are on bed rest.  · Keep track of the number of pads you use each day, how often you change pads, and how soaked (saturated) they are. Write this down.  · Do not use tampons. Do not douche.  · Do not have sexual intercourse or orgasms until approved by your health care provider.  · If you pass any tissue from your vagina, save the tissue so you can show it to your health care provider.  · Only take over-the-counter or prescription medicines as directed by your health care provider.  · Do not take aspirin because it can make you  bleed.  · Keep all follow-up appointments as directed by your health care provider.  SEEK MEDICAL CARE IF:  · You have any vaginal bleeding during any part of your pregnancy.  · You have cramps or labor pains.  · You have a fever, not controlled by medicine.  SEEK IMMEDIATE MEDICAL CARE IF:   · You have severe cramps in your back or belly (abdomen).  · You pass large clots or tissue from your vagina.  · Your bleeding increases.  · You feel light-headed or weak, or you have fainting episodes.  · You have chills.  · You are leaking fluid or have a gush of fluid from your vagina.  · You pass out while having a bowel movement.  MAKE SURE YOU:  · Understand these instructions.  · Will watch your condition.  · Will get help right away if you are not doing well or get worse.  Document Released: 01/30/2005 Document Revised: 04/27/2013 Document Reviewed: 12/28/2012  Exit[employer]® Patient Information ©2015 [employer]. This information is not intended to replace advice given to you by your health care provider. Make sure you discuss any questions you have with your health care provider.

## 2013-11-01 NOTE — MAU Note (Signed)
Urine in lab 

## 2013-12-04 ENCOUNTER — Encounter (HOSPITAL_COMMUNITY): Payer: Self-pay

## 2013-12-04 ENCOUNTER — Inpatient Hospital Stay (HOSPITAL_COMMUNITY)
Admission: AD | Admit: 2013-12-04 | Discharge: 2013-12-05 | Disposition: A | Discharge status: Home / Self Care | Payer: BC Managed Care – PPO | Source: Ambulatory Visit | Attending: Obstetrics and Gynecology | Admitting: Obstetrics and Gynecology

## 2013-12-04 ENCOUNTER — Inpatient Hospital Stay (HOSPITAL_COMMUNITY): Payer: BC Managed Care – PPO

## 2013-12-04 DIAGNOSIS — D252 Subserosal leiomyoma of uterus: Secondary | ICD-10-CM | POA: Insufficient documentation

## 2013-12-04 DIAGNOSIS — O3410 Maternal care for benign tumor of corpus uteri, unspecified trimester: Secondary | ICD-10-CM | POA: Insufficient documentation

## 2013-12-04 DIAGNOSIS — O200 Threatened abortion: Secondary | ICD-10-CM | POA: Insufficient documentation

## 2013-12-04 NOTE — MAU Provider Note (Signed)
Dana [name] is a 33 y.o. G2 P 1 at 12.5 weeks. Present to MAU c/o vaginal bleeding and passed a golf ball size clot.  Pt report mild cramping in the last hour.  She denies sexual intercourse in the last 48 hours.     History   Viability [place] from 7/1 indicated a IUP c/w dates.  Yolk sac and amnon seen. Small sub chorionic hemorrhage noted. fibroids x2.  FHR 167 Quant on 6/9 4265.6  Patient Active Problem List   Diagnosis Date Noted  . Fibroids, subserous - measuring 2x2 cm in left anterior corpus 11/01/2013  . Subchorionic hemorrhage in first trimester 11/01/2013  . Ventral hernia 09/04/2011    Chief Complaint  Patient presents with  . Vaginal Bleeding   HPI  OB History   Grav Para Term Preterm Abortions TAB SAB Ect Mult Living   2 1 1       1       Past Medical History  Diagnosis Date  . Ventral hernia 09/04/2011  . Thyroid disease    Blood type AB+  Past Surgical History  Procedure Laterality Date  . Wisdom tooth extraction  2001  . Eye surgery  2006    lasik - both eyes  . Ventral hernia repair  09/13/2011    Procedure: HERNIA REPAIR VENTRAL ADULT;  Surgeon: Imogene Burn. Georgette Dover, MD;  Location: [facility];  Service: General;  Laterality: N/A;  . Hernia repair      Family History  Problem Relation Age of Onset  . Cancer Maternal Grandmother     lung  . Hypertension Maternal Grandmother   . Hypertension Mother   . Heart disease Maternal Grandfather     History  Substance Use Topics  . Smoking status: Never Smoker   . Smokeless tobacco: Never Used  . Alcohol Use: No    Allergies: No Known Allergies  Prescriptions prior to admission  Medication Sig Dispense Refill  . docusate sodium (COLACE) 100 MG capsule Take 100 mg by mouth daily.       . Prenatal Vit-Fe Fumarate-FA (PRENATAL MULTIVITAMIN) TABS tablet Take 1 tablet by mouth daily.         ROS See HPI above, all other systems are negative  Physical Exam   Blood pressure 117/72, pulse  88, temperature 98.1 F (36.7 C), temperature source Oral, resp. rate 18, last menstrual period 08/29/2013, SpO2 99.00%.  Physical Exam  Ext:  WNL ABD: Soft, non tender to palpation, no rebound or guarding SVE: FT SSE, copious blood in the vaginal vault    ED Course  Assessment: IUP at  12.5weeks, viability [place] on 7/1 Membranes: pending FHR: 155 CTX: n/a  Threatened abortion   Plan: [place] to confirm current viability Consult with Dr. Durward Fortes Jennier Schissler, CNM, MSN 12/04/2013. 10:10 PM

## 2013-12-04 NOTE — MAU Note (Signed)
Bright red bleeding at home and passed 1 clot, golf ball size.  Last intercourse was 2 days ago.  Denies pain.

## 2013-12-05 LAB — CBC
HEMATOCRIT: 34.5 % — AB (ref 36.0–46.0)
HEMOGLOBIN: 11.5 g/dL — AB (ref 12.0–15.0)
MCH: 29.3 pg (ref 26.0–34.0)
MCHC: 33.3 g/dL (ref 30.0–36.0)
MCV: 87.8 fL (ref 78.0–100.0)
Platelets: 165 10*3/uL (ref 150–400)
RBC: 3.93 MIL/uL (ref 3.87–5.11)
RDW: 13.5 % (ref 11.5–15.5)
WBC: 8.3 10*3/uL (ref 4.0–10.5)

## 2013-12-05 NOTE — Progress Notes (Signed)
Addendum  A/P 32yo G 2 P 1001 with vaginal bleeding Bleeding has slowed from a moderate flow to a light flow  CBC WNL H/H: 11.5/34.5 DC to home  FU in the office on Tueday   Pelvic rest  Pt given SAB and bleeding precautions   Kevork Joyce, CNM, MSN 12/05/2013. 2:30 AM

## 2013-12-05 NOTE — Discharge Instructions (Signed)
Keep appointment on Tuesday Vaginal Bleeding During Pregnancy, First Trimester A small amount of bleeding (spotting) from the vagina is common in early pregnancy. Sometimes the bleeding is normal and is not a problem, and sometimes it is a sign of something serious. Be sure to tell your doctor about any bleeding from your vagina right away. HOME CARE  Watch your condition for any changes.  Follow your doctor's instructions about how active you can be.  If you are on bed rest:  You may need to stay in bed and only get up to use the bathroom.  You may be allowed to do some activities.  If you need help, make plans for someone to help you.  Write down:  The number of pads you use each day.  How often you change pads.  How soaked (saturated) your pads are.  Do not use tampons.  Do not douche.  Do not have sex or orgasms until your doctor says it is okay.  If you pass any tissue from your vagina, save the tissue so you can show it to your doctor.  Only take medicines as told by your doctor.  Do not take aspirin because it can make you bleed.  Keep all follow-up visits as told by your doctor. GET HELP IF:   You bleed from your vagina.  You have cramps.  You have labor pains.  You have a fever that does not go away after you take medicine. GET HELP RIGHT AWAY IF:   You have very bad cramps in your back or belly (abdomen).  You pass large clots or tissue from your vagina.  You bleed more.  You feel light-headed or weak.  You pass out (faint).  You have chills.  You are leaking fluid or have a gush of fluid from your vagina.  You pass out while pooping (having a bowel movement). MAKE SURE YOU:  Understand these instructions.  Will watch your condition.  Will get help right away if you are not doing well or get worse. Document Released: 09/06/2013 Document Reviewed: 12/28/2012 [facility] Patient Information 2015 [place]. This information is not  intended to replace advice given to you by your health care provider. Make sure you discuss any questions you have with your health care provider. Threatened Miscarriage A threatened miscarriage occurs when you have vaginal bleeding during your first 20 weeks of pregnancy but the pregnancy has not ended. If you have vaginal bleeding during this time, your health care provider will do tests to make sure you are still pregnant. If the tests show you are still pregnant and the developing baby (fetus) inside your womb (uterus) is still growing, your condition is considered a threatened miscarriage. A threatened miscarriage does not mean your pregnancy will end, but it does increase the risk of losing your pregnancy (complete miscarriage). CAUSES  The cause of a threatened miscarriage is usually not known. If you go on to have a complete miscarriage, the most common cause is an abnormal number of chromosomes in the developing baby. Chromosomes are the structures inside cells that hold all your genetic material. Some causes of vaginal bleeding that do not result in miscarriage include:  Having sex.  Having an infection.  Normal hormone changes of pregnancy.  Bleeding that occurs when an egg implants in your uterus. RISK FACTORS Risk factors for bleeding in early pregnancy include:  Obesity.  Smoking.  Drinking excessive amounts of alcohol or caffeine.  Recreational drug use. SIGNS AND SYMPTOMS  Light vaginal bleeding.  Mild abdominal pain or cramps. DIAGNOSIS  If you have bleeding with or without abdominal pain before 20 weeks of pregnancy, your health care provider will do tests to check whether you are still pregnant. One important test involves using sound waves and a computer (ultrasound) to create images of the inside of your uterus. Other tests include an internal exam of your vagina and uterus (pelvic exam) and measurement of your baby's heart rate.  You may be diagnosed with a  threatened miscarriage if:  Ultrasound testing shows you are still pregnant.  Your baby's heart rate is strong.  A pelvic exam shows that the opening between your uterus and your vagina (cervix) is closed.  Your heart rate and blood pressure are stable.  Blood tests confirm you are still pregnant. TREATMENT  No treatments have been shown to prevent a threatened miscarriage from going on to a complete miscarriage. However, the right home care is important.  HOME CARE INSTRUCTIONS   Make sure you keep all your appointments for prenatal care. This is very important.  Get plenty of rest.  Do not have sex or use tampons if you have vaginal bleeding.  Do not douche.  Do not smoke or use recreational drugs.  Do not drink alcohol.  Avoid caffeine. SEEK MEDICAL CARE IF:  You have light vaginal bleeding or spotting while pregnant.  You have abdominal pain or cramping.  You have a fever. SEEK IMMEDIATE MEDICAL CARE IF:  You have heavy vaginal bleeding.  You have blood clots coming from your vagina.  You have severe low back pain or abdominal cramps.  You have fever, chills, and severe abdominal pain. MAKE SURE YOU:  Understand these instructions.  Will watch your condition.  Will get help right away if you are not doing well or get worse. Document Released: 04/22/2005 Document Revised: 04/27/2013 Document Reviewed: 02/16/2013 [facility] Patient Information 2015 [place], [place]. This information is not intended to replace advice given to you by your health care provider. Make sure you discuss any questions you have with your health care provider. Pelvic Rest Pelvic rest is sometimes recommended for women when:   The placenta is partially or completely covering the opening of the cervix (placenta previa).  There is bleeding between the uterine wall and the amniotic sac in the first trimester (subchorionic hemorrhage).  The cervix begins to open without labor starting  (incompetent cervix, cervical insufficiency).  The labor is too early (preterm labor). HOME CARE INSTRUCTIONS  Do not have sexual intercourse, stimulation, or an orgasm.  Do not use tampons, douche, or put anything in the vagina.  Do not lift anything over 10 pounds (4.5 kg).  Avoid strenuous activity or straining your pelvic muscles. SEEK MEDICAL CARE IF:  You have any vaginal bleeding during pregnancy. Treat this as a potential emergency.  You have cramping pain felt low in the stomach (stronger than menstrual cramps).  You notice vaginal discharge (watery, mucus, or bloody).  You have a low, dull backache.  There are regular contractions or uterine tightening. SEEK IMMEDIATE MEDICAL CARE IF: You have vaginal bleeding and have placenta previa.  Document Released: 08/17/2010 Document Revised: 07/15/2011 Document Reviewed: 08/17/2010 [facility] Patient Information 2015 [place], [place]. This information is not intended to replace advice given to you by your health care provider. Make sure you discuss any questions you have with your health care provider.

## 2013-12-05 NOTE — MAU Provider Note (Signed)
[place] report no change from the last [place] on 7/1.  The sub chorionic hemorrhage is unchanged.   Threatened AB not RO Consulted with Dr. Mancel Bale Labs: CBC    Maeryn Mcgath, CNM, MSN 12/05/2013. 1:48 AM

## 2014-02-02 ENCOUNTER — Encounter: Payer: BC Managed Care – PPO | Attending: Obstetrics and Gynecology

## 2014-02-02 VITALS — Ht 65.0 in | Wt 213.3 lb

## 2014-02-02 DIAGNOSIS — Z713 Dietary counseling and surveillance: Secondary | ICD-10-CM | POA: Insufficient documentation

## 2014-02-02 DIAGNOSIS — O99810 Abnormal glucose complicating pregnancy: Secondary | ICD-10-CM | POA: Diagnosis present

## 2014-02-02 NOTE — Progress Notes (Signed)
  Patient was seen on 02/02/14 for Gestational Diabetes self-management class at the [facility]. The following learning objectives were met by the patient during this course:   States the definition of Gestational Diabetes  States why dietary management is important in controlling blood glucose  Describes the effects each nutrient has on blood glucose levels  Demonstrates ability to create a balanced meal plan  Demonstrates carbohydrate counting   States when to check blood glucose levels  Demonstrates proper blood glucose monitoring techniques  States the effect of stress and exercise on blood glucose levels  States the importance of limiting caffeine and abstaining from alcohol and smoking  Blood glucose monitor given: Accu Chek Nano BG Monitoring Kit Lot # [id number]  Exp: 8/312/16 Blood glucose reading: 114 mg/dl  Patient instructed to monitor glucose levels: FBS: 60 - <90 1 hour: <140 2 hour: <120  *Patient received handouts:  Nutrition Diabetes and Pregnancy  Carbohydrate Counting List  Patient will be seen for follow-up as needed.

## 2014-05-06 NOTE — L&D Delivery Note (Signed)
1345: In room to assess after high fowlers position.  Patient reporting constant pressure and urge to push.  Patient making good progress with pushing efforts.   Discussed shoulder dystocia maneuvers as patient with history of LGA infant.  Patient placed on left tilt and delivered as below.  FHR remained reassuring throughout pushing stage.   Delivery Note At 2:39 PM a viable female "Dana [name]" was delivered via Vaginal, Spontaneous Delivery (Presentation: Right Occiput Transverse).  Shoulders delivered easily and mother allowed to embrace infant and bring to her chest. Infant with good tone and spontaneous cry.  Nurses provided dry blankets and tactile stimulation.  Infant APGARs 9, 9. Cord clamped, cut, and blood collected. Placenta delivered spontaneously and noted to be intact, but velamentous, with 3VC upon inspection.  Vaginal inspection revealed a mild 2nd degree laceration that was repaired without the need for local anesthesia.  Fundus firm, at the umbilicus, and bleeding small.  Mother hemodynamically stable and infant at the breast prior to provider exit.  Mother desires immediate PP BTL.  Infant weight at one hour of life: 7lbs 8.1oz, 19.75in.   Family wishes for infant to receive an impatient circumcision.    Anesthesia: Epidural  Episiotomy: None Lacerations: 2nd degree;Perineal Suture Repair: 3.0 vicryl Est. Blood Loss (mL): 150  Mom to postpartum.  Baby to Couplet care / Skin to Skin.  Jariel Drost, Francetta Found, MSN, CNM 05/29/2014, 3:28 PM

## 2014-05-19 LAB — OB RESULTS CONSOLE GBS: GBS: NEGATIVE

## 2014-05-29 ENCOUNTER — Inpatient Hospital Stay (HOSPITAL_COMMUNITY): Payer: BLUE CROSS/BLUE SHIELD | Admitting: Anesthesiology

## 2014-05-29 ENCOUNTER — Inpatient Hospital Stay (HOSPITAL_COMMUNITY)
Admission: AD | Admit: 2014-05-29 | Discharge: 2014-05-31 | DRG: 767 | Disposition: A | Discharge status: Home / Self Care | Payer: BLUE CROSS/BLUE SHIELD | Source: Ambulatory Visit | Attending: Obstetrics and Gynecology | Admitting: Obstetrics and Gynecology

## 2014-05-29 ENCOUNTER — Encounter (HOSPITAL_COMMUNITY): Payer: Self-pay | Admitting: *Deleted

## 2014-05-29 DIAGNOSIS — D252 Subserosal leiomyoma of uterus: Secondary | ICD-10-CM | POA: Diagnosis present

## 2014-05-29 DIAGNOSIS — Z3A38 38 weeks gestation of pregnancy: Secondary | ICD-10-CM | POA: Diagnosis present

## 2014-05-29 DIAGNOSIS — Z3483 Encounter for supervision of other normal pregnancy, third trimester: Secondary | ICD-10-CM | POA: Diagnosis present

## 2014-05-29 DIAGNOSIS — Z302 Encounter for sterilization: Secondary | ICD-10-CM | POA: Diagnosis not present

## 2014-05-29 DIAGNOSIS — O24429 Gestational diabetes mellitus in childbirth, unspecified control: Principal | ICD-10-CM | POA: Diagnosis present

## 2014-05-29 DIAGNOSIS — O09523 Supervision of elderly multigravida, third trimester: Secondary | ICD-10-CM

## 2014-05-29 DIAGNOSIS — O3413 Maternal care for benign tumor of corpus uteri, third trimester: Secondary | ICD-10-CM | POA: Diagnosis present

## 2014-05-29 DIAGNOSIS — O43129 Velamentous insertion of umbilical cord, unspecified trimester: Secondary | ICD-10-CM | POA: Diagnosis present

## 2014-05-29 LAB — CBC
HCT: 35.9 % — ABNORMAL LOW (ref 36.0–46.0)
Hemoglobin: 11.9 g/dL — ABNORMAL LOW (ref 12.0–15.0)
MCH: 29.1 pg (ref 26.0–34.0)
MCHC: 33.1 g/dL (ref 30.0–36.0)
MCV: 87.8 fL (ref 78.0–100.0)
PLATELETS: 128 10*3/uL — AB (ref 150–400)
RBC: 4.09 MIL/uL (ref 3.87–5.11)
RDW: 14.1 % (ref 11.5–15.5)
WBC: 10.1 10*3/uL (ref 4.0–10.5)

## 2014-05-29 LAB — TYPE AND SCREEN
ABO/RH(D): AB POS
ANTIBODY SCREEN: NEGATIVE

## 2014-05-29 LAB — GLUCOSE, CAPILLARY
GLUCOSE-CAPILLARY: 84 mg/dL (ref 70–99)
GLUCOSE-CAPILLARY: 107 mg/dL — AB (ref 70–99)
GLUCOSE-CAPILLARY: 68 mg/dL — AB (ref 70–99)

## 2014-05-29 LAB — ABO/RH: ABO/RH(D): AB POS

## 2014-05-29 MED ORDER — LACTATED RINGERS IV SOLN
500.0000 mL | INTRAVENOUS | Status: DC | PRN
  Administered 2014-05-29: 500 mL via INTRAVENOUS

## 2014-05-29 MED ORDER — FENTANYL 2.5 MCG/ML BUPIVACAINE 1/10 % EPIDURAL INFUSION (WH - ANES)
14.0000 mL/h | INTRAMUSCULAR | Status: DC | PRN
  Administered 2014-05-29: 14 mL/h via EPIDURAL
  Filled 2014-05-29: qty 125

## 2014-05-29 MED ORDER — PHENYLEPHRINE 40 MCG/ML (10ML) SYRINGE FOR IV PUSH (FOR BLOOD PRESSURE SUPPORT)
80.0000 ug | PREFILLED_SYRINGE | INTRAVENOUS | Status: DC | PRN
  Filled 2014-05-29: qty 2

## 2014-05-29 MED ORDER — OXYCODONE-ACETAMINOPHEN 5-325 MG PO TABS: 2.0000 | ORAL_TABLET | ORAL | Status: DC | PRN

## 2014-05-29 MED ORDER — LACTATED RINGERS IV SOLN
INTRAVENOUS | Status: DC
  Administered 2014-05-29: 09:00:00 via INTRAVENOUS

## 2014-05-29 MED ORDER — SIMETHICONE 80 MG PO CHEW: 80.0000 mg | CHEWABLE_TABLET | ORAL | Status: DC | PRN

## 2014-05-29 MED ORDER — DIPHENHYDRAMINE HCL 50 MG/ML IJ SOLN: 12.5000 mg | INTRAMUSCULAR | Status: DC | PRN

## 2014-05-29 MED ORDER — ACETAMINOPHEN 325 MG PO TABS: 650.0000 mg | ORAL_TABLET | ORAL | Status: DC | PRN

## 2014-05-29 MED ORDER — OXYCODONE-ACETAMINOPHEN 5-325 MG PO TABS
1.0000 | ORAL_TABLET | ORAL | Status: DC | PRN
  Administered 2014-05-30 (×3): 1 via ORAL
  Filled 2014-05-29 (×3): qty 1

## 2014-05-29 MED ORDER — CITRIC ACID-SODIUM CITRATE 334-500 MG/5ML PO SOLN: 30.0000 mL | ORAL | Status: DC | PRN

## 2014-05-29 MED ORDER — LACTATED RINGERS IV SOLN: 500.0000 mL | Freq: Once | INTRAVENOUS | Status: DC

## 2014-05-29 MED ORDER — LIDOCAINE HCL (PF) 1 % IJ SOLN
INTRAMUSCULAR | Status: DC | PRN
  Administered 2014-05-29 (×2): 5 mL

## 2014-05-29 MED ORDER — LIDOCAINE HCL (PF) 1 % IJ SOLN
30.0000 mL | INTRAMUSCULAR | Status: DC | PRN
  Filled 2014-05-29: qty 30

## 2014-05-29 MED ORDER — OXYTOCIN 40 UNITS IN LACTATED RINGERS INFUSION - SIMPLE MED
62.5000 mL/h | INTRAVENOUS | Status: DC
  Filled 2014-05-29: qty 1000

## 2014-05-29 MED ORDER — OXYTOCIN BOLUS FROM INFUSION: 500.0000 mL | INTRAVENOUS | Status: DC

## 2014-05-29 MED ORDER — PHENYLEPHRINE 40 MCG/ML (10ML) SYRINGE FOR IV PUSH (FOR BLOOD PRESSURE SUPPORT)
80.0000 ug | PREFILLED_SYRINGE | INTRAVENOUS | Status: DC | PRN
  Filled 2014-05-29: qty 20
  Filled 2014-05-29: qty 2

## 2014-05-29 MED ORDER — TERBUTALINE SULFATE 1 MG/ML IJ SOLN
0.2500 mg | Freq: Once | INTRAMUSCULAR | Status: DC | PRN
  Filled 2014-05-29: qty 1

## 2014-05-29 MED ORDER — TERBUTALINE SULFATE 1 MG/ML IJ SOLN
0.2500 mg | Freq: Once | INTRAMUSCULAR | Status: DC | PRN
Start: 2014-05-29 — End: 2014-05-29
  Filled 2014-05-29: qty 1

## 2014-05-29 MED ORDER — LANOLIN HYDROUS EX OINT: TOPICAL_OINTMENT | CUTANEOUS | Status: DC | PRN

## 2014-05-29 MED ORDER — BENZOCAINE-MENTHOL 20-0.5 % EX AERO
1.0000 "application " | INHALATION_SPRAY | CUTANEOUS | Status: DC | PRN
  Administered 2014-05-29: 1 via TOPICAL
  Filled 2014-05-29: qty 56

## 2014-05-29 MED ORDER — IBUPROFEN 600 MG PO TABS
600.0000 mg | ORAL_TABLET | Freq: Four times a day (QID) | ORAL | Status: DC
  Administered 2014-05-29 – 2014-05-31 (×7): 600 mg via ORAL
  Filled 2014-05-29 (×7): qty 1

## 2014-05-29 MED ORDER — DIBUCAINE 1 % RE OINT
1.0000 "application " | TOPICAL_OINTMENT | RECTAL | Status: DC | PRN
  Administered 2014-05-29: 1 via RECTAL
  Filled 2014-05-29: qty 28

## 2014-05-29 MED ORDER — OXYCODONE-ACETAMINOPHEN 5-325 MG PO TABS: 1.0000 | ORAL_TABLET | ORAL | Status: DC | PRN

## 2014-05-29 MED ORDER — PRENATAL MULTIVITAMIN CH
1.0000 | ORAL_TABLET | Freq: Every day | ORAL | Status: DC
  Administered 2014-05-30 – 2014-05-31 (×2): 1 via ORAL
  Filled 2014-05-29 (×2): qty 1

## 2014-05-29 MED ORDER — TETANUS-DIPHTH-ACELL PERTUSSIS 5-2.5-18.5 LF-MCG/0.5 IM SUSP: 0.5000 mL | Freq: Once | INTRAMUSCULAR | Status: DC

## 2014-05-29 MED ORDER — ONDANSETRON HCL 4 MG/2ML IJ SOLN: 4.0000 mg | Freq: Four times a day (QID) | INTRAMUSCULAR | Status: DC | PRN

## 2014-05-29 MED ORDER — ZOLPIDEM TARTRATE 5 MG PO TABS: 5.0000 mg | ORAL_TABLET | Freq: Every evening | ORAL | Status: DC | PRN

## 2014-05-29 MED ORDER — ONDANSETRON HCL 4 MG/2ML IJ SOLN: 4.0000 mg | INTRAMUSCULAR | Status: DC | PRN

## 2014-05-29 MED ORDER — EPHEDRINE 5 MG/ML INJ
10.0000 mg | INTRAVENOUS | Status: DC | PRN
  Filled 2014-05-29: qty 2

## 2014-05-29 MED ORDER — WITCH HAZEL-GLYCERIN EX PADS
1.0000 "application " | MEDICATED_PAD | CUTANEOUS | Status: DC | PRN
  Administered 2014-05-29 – 2014-05-31 (×2): 1 via TOPICAL

## 2014-05-29 MED ORDER — ONDANSETRON HCL 4 MG PO TABS: 4.0000 mg | ORAL_TABLET | ORAL | Status: DC | PRN

## 2014-05-29 MED ORDER — DIPHENHYDRAMINE HCL 25 MG PO CAPS: 25.0000 mg | ORAL_CAPSULE | Freq: Four times a day (QID) | ORAL | Status: DC | PRN

## 2014-05-29 MED ORDER — SENNOSIDES-DOCUSATE SODIUM 8.6-50 MG PO TABS
2.0000 | ORAL_TABLET | ORAL | Status: DC
  Administered 2014-05-29 – 2014-05-31 (×2): 2 via ORAL
  Filled 2014-05-29 (×2): qty 2

## 2014-05-29 MED ORDER — OXYTOCIN 40 UNITS IN LACTATED RINGERS INFUSION - SIMPLE MED
1.0000 m[IU]/min | INTRAVENOUS | Status: DC
  Administered 2014-05-29: 2 m[IU]/min via INTRAVENOUS

## 2014-05-29 NOTE — MAU Provider Note (Signed)
Dana [name] is a 34 y.o. G2P1001 at 37.6 weeks presents to MAU c/o ctx q 4-5 minutes lasting 1 minutes for the last 3 hours.  She denies vb, or lof w/+FM.     History     Patient Active Problem List   Diagnosis Date Noted  . Fibroids, subserous - measuring 2x2 cm in left anterior corpus 11/01/2013  . Subchorionic hemorrhage in first trimester 11/01/2013  . Ventral hernia 09/04/2011    No chief complaint on file.  HPI  OB History    Gravida Para Term Preterm AB TAB SAB Ectopic Multiple Living   2 1 1       1       Past Medical History  Diagnosis Date  . Ventral hernia 09/04/2011  . Thyroid disease   . Diabetes mellitus without complication     gestational diabetes    Past Surgical History  Procedure Laterality Date  . Wisdom tooth extraction  2001  . Eye surgery  2006    lasik - both eyes  . Ventral hernia repair  09/13/2011    Procedure: HERNIA REPAIR VENTRAL ADULT;  Surgeon: Imogene Burn. Georgette Dover, MD;  Location: [place];  Service: General;  Laterality: N/A;  . Hernia repair      Family History  Problem Relation Age of Onset  . Cancer Maternal Grandmother     lung  . Hypertension Maternal Grandmother   . Hypertension Mother   . Heart disease Maternal Grandfather     History  Substance Use Topics  . Smoking status: Never Smoker   . Smokeless tobacco: Never Used  . Alcohol Use: No    Allergies: No Known Allergies  Prescriptions prior to admission  Medication Sig Dispense Refill Last Dose  . Prenatal Vit-Fe Fumarate-FA (PRENATAL MULTIVITAMIN) TABS tablet Take 1 tablet by mouth daily.    05/28/2014 at Unknown time  . docusate sodium (COLACE) 100 MG capsule Take 100 mg by mouth daily.    Taking    ROS See HPI above, all other systems are negative  Physical Exam   Height 5\' 5"  (1.651 m), weight 215 lb (97.523 kg), last menstrual period 08/29/2013.  Physical Exam Ext:  WNL ABD: Soft, non tender to palpation, no rebound or guarding SVE:  2-3/50/-3 posterior   ED Course  Assessment: IUP at  37.6 weeks Membranes: itact FHR: Category 2.  Prolong ctx at 0404 w/decel.  Pt report moving at that time CTX:  3-5 minutes   Plan: Monitor for 1 hour for reassurance and recheck for cervical change   Shanyiah Conde, CNM, MSN 05/29/2014. 4:07 AM

## 2014-05-29 NOTE — Progress Notes (Signed)
Dana [name] MRN: [id number]  Subjective: -Patient remains comfortable.  Reports some "tightening" with contractions, but no constant pressure.   Objective: BP 116/73 mmHg  Pulse 88  Temp(Src) 98.7 F (37.1 C) (Oral)  Resp 18  Ht 5\' 5"  (1.651 m)  Wt 215 lb (97.523 kg)  BMI 35.78 kg/m2  SpO2 100%  LMP 08/29/2013     FHT: 135 bpm, Mod Var, -Decels, +Accels UC:  Q42min  SVE:   Dilation: 10 Effacement (%): 100 Station: -2 Exam by:: Zeki Bedrosian cnm Membranes:SROM at 0800 Pitocin: Initiated at 39mUn  Assessment:  IUP at 38.2wks Cat I FT  2nd Stage Labor Inadequate Contraction Pattern  Plan: -Initiate pitocin to increase frequency of contractions and assist with fetal descent -Will place in high fowlers and allow to labor down for 59min -Continue other mgmt as ordered  Delsa Walder LYNN,MSN, CNM 05/29/2014, 1:15 PM

## 2014-05-29 NOTE — MAU Note (Signed)
Pt having contractions 5-1min apart starting around midnight.  Denies LOF or bleeding.  States baby moving well.

## 2014-05-29 NOTE — Anesthesia Preprocedure Evaluation (Signed)
Anesthesia Evaluation  Patient identified by MRN, date of birth, ID band Patient awake    Reviewed: Allergy & Precautions, H&P , Patient's Chart, lab work & pertinent test results  Airway Mallampati: II  TM Distance: >3 FB Neck ROM: full    Dental   Pulmonary  breath sounds clear to auscultation        Cardiovascular Rhythm:regular Rate:Normal     Neuro/Psych    GI/Hepatic   Endo/Other  diabetesMorbid obesity  Renal/GU      Musculoskeletal   Abdominal   Peds  Hematology   Anesthesia Other Findings   Reproductive/Obstetrics (+) Pregnancy                             Anesthesia Physical Anesthesia Plan  ASA: III  Anesthesia Plan: Epidural   Post-op Pain Management:    Induction:   Airway Management Planned:   Additional Equipment:   Intra-op Plan:   Post-operative Plan:   Informed Consent: I have reviewed the patients History and Physical, chart, labs and discussed the procedure including the risks, benefits and alternatives for the proposed anesthesia with the patient or authorized representative who has indicated his/her understanding and acceptance.     Plan Discussed with:   Anesthesia Plan Comments:         Anesthesia Quick Evaluation

## 2014-05-29 NOTE — Anesthesia Preprocedure Evaluation (Addendum)
Anesthesia Evaluation  Patient identified by MRN, date of birth, ID band Patient awake    Reviewed: Allergy & Precautions, H&P , NPO status , Patient's Chart, lab work & pertinent test results  Airway Mallampati: II  TM Distance: >3 FB Neck ROM: full    Dental  (+) Teeth Intact   Pulmonary neg pulmonary ROS,  breath sounds clear to auscultation  Pulmonary exam normal       Cardiovascular negative cardio ROS  Rhythm:regular Rate:Normal     Neuro/Psych negative neurological ROS  negative psych ROS   GI/Hepatic negative GI ROS, Neg liver ROS,   Endo/Other  diabetes, Well Controlled, GestationalMorbid obesity  Renal/GU negative Renal ROS  negative genitourinary   Musculoskeletal negative musculoskeletal ROS (+)   Abdominal (+) + obese,   Peds  Hematology  (+) anemia ,   Anesthesia Other Findings   Reproductive/Obstetrics negative OB ROS                            Anesthesia Physical  Anesthesia Plan  ASA: III  Anesthesia Plan: Epidural   Post-op Pain Management:    Induction:   Airway Management Planned: Natural Airway  Additional Equipment:   Intra-op Plan:   Post-operative Plan:   Informed Consent: I have reviewed the patients History and Physical, chart, labs and discussed the procedure including the risks, benefits and alternatives for the proposed anesthesia with the patient or authorized representative who has indicated his/her understanding and acceptance.     Plan Discussed with: Anesthesiologist, CRNA and Surgeon  Anesthesia Plan Comments:         Anesthesia Quick Evaluation

## 2014-05-29 NOTE — Progress Notes (Addendum)
SOLVEIG FANGMAN MRN: [id number]  Subjective: -Nurse call stating patient feeling increased pressure.  Also reports SROM.    Objective: BP 117/78 mmHg  Pulse 96  Temp(Src) 98.4 F (36.9 C) (Axillary)  Resp 18  Ht 5\' 5"  (1.651 m)  Wt 215 lb (97.523 kg)  BMI 35.78 kg/m2  SpO2 100%  LMP 08/29/2013     FHT:135  bpm, Mod Var, -Decels, +Accels UC:   Q1-7min, palpates moderate SVE:   Dilation: 5 Effacement (%): 60 Station: -2 Exam by:: J.Minnetta Sandora CNM [place] Noted Membranes:SROM Pitocin: None  Assessment:  IUP at 38.2wks Cat I FT  GBS Negative Active Labor-Progressive SROM  Plan: -Expectant Management -Continue other mgmt as ordered  Suki Crockett LYNN,MSN, CNM 05/29/2014, 8:15 AM

## 2014-05-29 NOTE — Progress Notes (Signed)
Dana [name] MRN: [id number]  Subjective: -Patient resting in bed.  Reports comfort with epidural.  Husband at bedside.   Objective: BP 105/69 mmHg  Pulse 112  Temp(Src) 98.3 F (36.8 C) (Oral)  Resp 18  Ht 5\' 5"  (1.651 m)  Wt 215 lb (97.523 kg)  BMI 35.78 kg/m2  SpO2 100%  LMP 08/29/2013     FHT: 120 bpm, Mod Var, + Variable Decels, +Accels UC:  Q2-68min, palpates moderate SVE:   Dilation: 10 Effacement (%): 100 Station: -2 Exam by:: Dana [name] cnm  Thick Meconium Noted Membranes:SROM Pitocin:None  Assessment:  IUP at 38.2wks Cat II FT  2nd Stage Labor MSAF  Plan: -Position change to promote fetal descent and resolution of Cat II FT -Discussed MSAF with patient and need for NICU attendance at delivery -Will allow one hour to labor down and then reassess -Continue other mgmt as ordered   Acy Orsak LYNN,MSN, CNM 05/29/2014, 12:00 PM

## 2014-05-29 NOTE — Anesthesia Procedure Notes (Signed)
Epidural Patient location during procedure: OB Start time: 05/29/2014 7:58 AM  Staffing Anesthesiologist: Rudean Curt Performed by: anesthesiologist   Preanesthetic Checklist Completed: patient identified, site marked, surgical consent, pre-op evaluation, timeout performed, IV checked, risks and benefits discussed and monitors and equipment checked  Epidural Patient position: sitting Prep: site prepped and draped and DuraPrep Patient monitoring: continuous pulse ox and blood pressure Approach: midline Location: L3-L4 Injection technique: LOR air  Needle:  Needle type: Tuohy  Needle gauge: 17 G Needle length: 9 cm and 9 Needle insertion depth: 5 cm cm Catheter type: closed end flexible Catheter size: 19 Gauge Catheter at skin depth: 10 cm Test dose: negative  Assessment Events: blood not aspirated, injection not painful, no injection resistance, negative IV test and no paresthesia  Additional Notes Patient identified.  Risk benefits discussed including failed block, incomplete pain control, headache, nerve damage, paralysis, blood pressure changes, nausea, vomiting, reactions to medication both toxic or allergic, and postpartum back pain.  Patient expressed understanding and wished to proceed.  All questions were answered.  Sterile technique used throughout procedure and epidural site dressed with sterile barrier dressing. No paresthesia or other complications noted.The patient did not experience any signs of intravascular injection such as tinnitus or metallic taste in mouth nor signs of intrathecal spread such as rapid motor block. Please see nursing notes for vital signs.

## 2014-05-29 NOTE — H&P (Signed)
Dana [name] is a 34 y.o. female, G2 P1001 at 38.2 weeks by [place]  Patient Active Problem List   Diagnosis Date Noted  . Normal labor 05/29/2014  . Fibroids, subserous - measuring 2x2 cm in left anterior corpus 11/01/2013  . Subchorionic hemorrhage in first trimester 11/01/2013  . Ventral hernia 09/04/2011    Pregnancy Course: Patient entered care at 6.3 weeks.   EDC of 06/10/14 was established by [place].      [place] evaluations:   6.3 weeks - Dating: anterverted uterus, yold sac seen no fetal pole, FU in 3 weeks  9.4 weeks - FU:  AUA 8.6 weeks, CRL 2.18cm, FHR 167, anterverted urerus, small [place], 2 fibroids  21.4 weeks - Anatomy: EFW 15oz, cervical length 4.02,   velamentous cord, vertex, posterior fundal placenta, female.  Placenta thins in themiddle where the cord is inserted  25.4 weeks - FU: EFW 1lb 14ox, cervical length 5.06, FHR 151,  Vertex, normal fluid, cervix closed, no abruption seen, velamentous cord  32.2 weeks - FU:EFW 4lb 3oz - 35%, cervical length 3.85, AFI 11.89, FHR 150, vertex, posterior fundal placenta BPP 8/8  35.5 weeks - FU: S>D EFW 7lb0z - 8%, AFI 12.75, BPP 8/8, FHR 150, vertex,   Significant prenatal events:   GDM, Velamentous cord,   [facility] Last evaluation:   37.6 weeks   VE:2/50/-3 on 05/26/14  Reason for admission:  labor  Pt States:   Contractions Frequency: 3-5         Contraction severity: strong         Fetal activity: +FM  OB History    Gravida Para Term Preterm AB TAB SAB Ectopic Multiple Living   2 1 1       1      Past Medical History  Diagnosis Date  . Ventral hernia 09/04/2011  . Thyroid disease   . Diabetes mellitus without complication     gestational diabetes   Past Surgical History  Procedure Laterality Date  . Wisdom tooth extraction  2001  . Eye surgery  2006    lasik - both eyes  . Ventral hernia repair  09/13/2011    Procedure: HERNIA REPAIR VENTRAL ADULT;  Surgeon: Imogene Burn. Georgette Dover, MD;  Location: [place];   Service: General;  Laterality: N/A;  . Hernia repair     Family History: family history includes Cancer in her maternal grandmother; Heart disease in her maternal grandfather; Hypertension in her maternal grandmother and mother. Social History:  reports that she has never smoked. She has never used smokeless tobacco. She reports that she does not drink alcohol or use illicit drugs.   Prenatal Transfer Tool  Maternal Diabetes: Yes:  Diabetes Type:  Diet controlled Genetic Screening: Declined Maternal Ultrasounds/Referrals: Abnormal:  Findings:   Other:velamentous cord Fetal Ultrasounds or other Referrals:  None Maternal Substance Abuse:  No Significant Maternal Medications:  None Significant Maternal Lab Results: None   ROS:  See HPI above, all other systems are negative  No Known Allergies  Dilation: 4 Effacement (%): 60 Station: -2 Exam by:: V. Vicki Pasqual CNM Blood pressure 128/86, pulse 92, temperature 98.4 F (36.9 C), temperature source Axillary, resp. rate 18, height 5\' 5"  (1.651 m), weight 215 lb (97.523 kg), last menstrual period 08/29/2013.  Maternal Exam:  Uterine Assessment: Contraction frequency is rare.  Abdomen: Gravid, non tender. Fundal height is aga.  Normal external genitalia, vulva, cervix, uterus and adnexa.  No lesions noted on exam.  Pelvis adequate for delivery.  Proved to 9lb 2oz Fetal presentation: Vertex by VE  Fetal Exam:  Monitor Surveillance : Continuous Monitoring  Mode: Ultrasound.  NICHD: Category 1 CTXs: Q 3-72minutes EFW   8 lbs  Physical Exam: Nursing note and vitals reviewed General: alert and cooperative She appears well nourished Psychiatric: Normal mood and affect. Her behavior is normal Head: Normocephalic Eyes: Pupils are equal, round, and reactive to light Neck: Normal range of motion Cardiovascular: RRR without murmur  Respiratory: CTAB. Effort normal  Abd: soft, non-tender, +BS, no rebound, no guarding  Genitourinary: Vagina  normal  Neurological: A&Ox3 Skin: Warm and dry  Musculoskeletal: Normal range of motion  Homan's sign negative bilaterally No evidence of DVTs.  Edema: Minimal bilaterally non-pitting edema DTR: 2+ Clonus: None   Prenatal labs: ABO, Rh: AB/Positive/-- (06/11 0000) Antibody: Negative (06/11 0000) Rubella:   immune RPR: Nonreactive (06/11 0000)  HBsAg: Negative (06/11 0000)  HIV: Non-reactive (06/11 0000)  GBS: Negative (01/14 0000) Sickle cell/Hgb electrophoresis:  AA Pap:  11/11/13 nwl GC: negative    Chlamydia: negative Genetic screenings:  decline Glucola:  GDM  Assessment:  IUP at 38 weeks NICHD: Category Membranes: intact Bishop Score: 7 GBS negative  Plan:  Admit to L&D for expectant/active management of labor. Possible augmentation options reviewed including AROM and/or pitocin.  IV pain medication per orders PRN Epidural per patient request Foley cath after patient is comfortable with epidural Anticipate SVD  Labor mgmt as ordered    Pt desires BTL in [facility]   Attending MD available at all times.    Simrin Vegh, CNM, MSN 05/29/2014, 6:51 AM

## 2014-05-30 ENCOUNTER — Inpatient Hospital Stay (HOSPITAL_COMMUNITY): Payer: BLUE CROSS/BLUE SHIELD | Admitting: Anesthesiology

## 2014-05-30 ENCOUNTER — Encounter (HOSPITAL_COMMUNITY)
Admission: AD | Disposition: A | Discharge status: Home / Self Care | Payer: Self-pay | Source: Ambulatory Visit | Attending: Obstetrics and Gynecology

## 2014-05-30 ENCOUNTER — Encounter (HOSPITAL_COMMUNITY): Payer: Self-pay | Admitting: Anesthesiology

## 2014-05-30 HISTORY — PX: TUBAL LIGATION: SHX77

## 2014-05-30 LAB — CBC
HEMATOCRIT: 35.8 % — AB (ref 36.0–46.0)
HEMOGLOBIN: 11.6 g/dL — AB (ref 12.0–15.0)
MCH: 28.6 pg (ref 26.0–34.0)
MCHC: 32.4 g/dL (ref 30.0–36.0)
MCV: 88.4 fL (ref 78.0–100.0)
Platelets: 123 10*3/uL — ABNORMAL LOW (ref 150–400)
RBC: 4.05 MIL/uL (ref 3.87–5.11)
RDW: 14.5 % (ref 11.5–15.5)
WBC: 8.3 10*3/uL (ref 4.0–10.5)

## 2014-05-30 LAB — HIV ANTIBODY (ROUTINE TESTING W REFLEX)
HIV 1/O/2 Abs-Index Value: <1 (ref <1.00)
HIV-1/HIV-2 Ab: NONREACTIVE

## 2014-05-30 LAB — MRSA PCR SCREENING: MRSA by PCR: NEGATIVE

## 2014-05-30 LAB — RPR: RPR: NONREACTIVE

## 2014-05-30 SURGERY — LIGATION, FALLOPIAN TUBE, POSTPARTUM
Anesthesia: Epidural | Site: Abdomen | Laterality: Bilateral

## 2014-05-30 MED ORDER — LIDOCAINE-EPINEPHRINE (PF) 2 %-1:200000 IJ SOLN
INTRAMUSCULAR | Status: AC
  Filled 2014-05-30: qty 20

## 2014-05-30 MED ORDER — ONDANSETRON HCL 4 MG/2ML IJ SOLN
INTRAMUSCULAR | Status: AC
  Filled 2014-05-30: qty 2

## 2014-05-30 MED ORDER — LACTATED RINGERS IV SOLN
INTRAVENOUS | Status: DC
  Administered 2014-05-30 (×2): via INTRAVENOUS

## 2014-05-30 MED ORDER — BUPIVACAINE-EPINEPHRINE 0.5% -1:200000 IJ SOLN
INTRAMUSCULAR | Status: DC | PRN
  Administered 2014-05-30: 4 mL

## 2014-05-30 MED ORDER — BUPIVACAINE-EPINEPHRINE (PF) 0.5% -1:200000 IJ SOLN
INTRAMUSCULAR | Status: AC
  Filled 2014-05-30: qty 30

## 2014-05-30 MED ORDER — METOCLOPRAMIDE HCL 10 MG PO TABS
10.0000 mg | ORAL_TABLET | Freq: Once | ORAL | Status: AC
Start: 2014-05-30 — End: 2014-05-30
  Administered 2014-05-30: 10 mg via ORAL
  Filled 2014-05-30: qty 1

## 2014-05-30 MED ORDER — ONDANSETRON HCL 4 MG/2ML IJ SOLN
INTRAMUSCULAR | Status: DC | PRN
  Administered 2014-05-30: 4 mg via INTRAVENOUS

## 2014-05-30 MED ORDER — KETOROLAC TROMETHAMINE 30 MG/ML IJ SOLN
INTRAMUSCULAR | Status: DC | PRN
  Administered 2014-05-30: 30 mg via INTRAVENOUS

## 2014-05-30 MED ORDER — FAMOTIDINE 20 MG PO TABS
40.0000 mg | ORAL_TABLET | Freq: Once | ORAL | Status: AC
Start: 2014-05-30 — End: 2014-05-30
  Administered 2014-05-30: 40 mg via ORAL
  Filled 2014-05-30: qty 2

## 2014-05-30 MED ORDER — FENTANYL CITRATE 0.05 MG/ML IJ SOLN
INTRAMUSCULAR | Status: DC | PRN
  Administered 2014-05-30: 50 ug via INTRAVENOUS

## 2014-05-30 MED ORDER — SODIUM BICARBONATE 8.4 % IV SOLN
INTRAVENOUS | Status: AC
  Filled 2014-05-30: qty 50

## 2014-05-30 MED ORDER — METOCLOPRAMIDE HCL 5 MG/ML IJ SOLN: 10.0000 mg | Freq: Once | INTRAMUSCULAR | Status: AC | PRN

## 2014-05-30 MED ORDER — MEPERIDINE HCL 25 MG/ML IJ SOLN: 6.2500 mg | INTRAMUSCULAR | Status: DC | PRN

## 2014-05-30 MED ORDER — MIDAZOLAM HCL 2 MG/2ML IJ SOLN
INTRAMUSCULAR | Status: AC
  Filled 2014-05-30: qty 2

## 2014-05-30 MED ORDER — KETOROLAC TROMETHAMINE 30 MG/ML IJ SOLN
INTRAMUSCULAR | Status: AC
  Filled 2014-05-30: qty 1

## 2014-05-30 MED ORDER — BUPIVACAINE HCL (PF) 0.5 % IJ SOLN
INTRAMUSCULAR | Status: AC
Start: 2014-05-30 — End: 2014-05-30
  Filled 2014-05-30: qty 30

## 2014-05-30 MED ORDER — LIDOCAINE-EPINEPHRINE (PF) 2 %-1:200000 IJ SOLN
INTRAMUSCULAR | Status: DC | PRN
  Administered 2014-05-30: 3 mL via EPIDURAL
  Administered 2014-05-30 (×2): 5 mL via EPIDURAL
  Administered 2014-05-30: 2 mL via EPIDURAL

## 2014-05-30 MED ORDER — FENTANYL CITRATE 0.05 MG/ML IJ SOLN: 25.0000 ug | INTRAMUSCULAR | Status: DC | PRN

## 2014-05-30 MED ORDER — KETOROLAC TROMETHAMINE 30 MG/ML IJ SOLN
INTRAMUSCULAR | Status: AC
Start: 2014-05-30 — End: 2014-05-30
  Filled 2014-05-30: qty 1

## 2014-05-30 MED ORDER — FENTANYL CITRATE 0.05 MG/ML IJ SOLN
INTRAMUSCULAR | Status: AC
  Filled 2014-05-30: qty 2

## 2014-05-30 SURGICAL SUPPLY — 32 items
CHLORAPREP W/TINT 26ML (MISCELLANEOUS) ×3 IMPLANT
CLOTH BEACON ORANGE TIMEOUT ST (SAFETY) ×3 IMPLANT
CONTAINER PREFILL 10% NBF 15ML (MISCELLANEOUS) ×6 IMPLANT
DECANTER SPIKE VIAL GLASS SM (MISCELLANEOUS) ×3 IMPLANT
DRSG OPSITE POSTOP 3X4 (GAUZE/BANDAGES/DRESSINGS) ×3 IMPLANT
ELECT REM PT RETURN 9FT ADLT (ELECTROSURGICAL) ×3
ELECTRODE REM PT RTRN 9FT ADLT (ELECTROSURGICAL) ×1 IMPLANT
GLOVE BIOGEL PI IND STRL 7.0 (GLOVE) ×3 IMPLANT
GLOVE BIOGEL PI IND STRL 8.5 (GLOVE) ×1 IMPLANT
GLOVE BIOGEL PI INDICATOR 7.0 (GLOVE) ×6
GLOVE BIOGEL PI INDICATOR 8.5 (GLOVE) ×2
GLOVE ECLIPSE 7.0 STRL STRAW (GLOVE) ×3 IMPLANT
GLOVE ECLIPSE 8.0 STRL XLNG CF (GLOVE) ×6 IMPLANT
GOWN STRL REUS W/ TWL XL LVL3 (GOWN DISPOSABLE) ×1 IMPLANT
GOWN STRL REUS W/TWL 2XL LVL3 (GOWN DISPOSABLE) IMPLANT
GOWN STRL REUS W/TWL LRG LVL3 (GOWN DISPOSABLE) ×3 IMPLANT
GOWN STRL REUS W/TWL XL LVL3 (GOWN DISPOSABLE) ×2
NEEDLE HYPO 25X1 1.5 SAFETY (NEEDLE) ×3 IMPLANT
NS IRRIG 1000ML POUR BTL (IV SOLUTION) ×3 IMPLANT
PACK ABDOMINAL MINOR (CUSTOM PROCEDURE TRAY) ×3 IMPLANT
PENCIL BUTTON HOLSTER BLD 10FT (ELECTRODE) ×3 IMPLANT
SPONGE GAUZE 2X2 8PLY STER LF (GAUZE/BANDAGES/DRESSINGS) ×1
SPONGE GAUZE 2X2 8PLY STRL LF (GAUZE/BANDAGES/DRESSINGS) ×2 IMPLANT
SPONGE LAP 4X18 X RAY DECT (DISPOSABLE) ×3 IMPLANT
SUT MNCRL AB 3-0 PS2 27 (SUTURE) ×3 IMPLANT
SUT PLAIN 0 NONE (SUTURE) ×3 IMPLANT
SUT VIC AB 2-0 CT1 27 (SUTURE) ×2
SUT VIC AB 2-0 CT1 TAPERPNT 27 (SUTURE) ×1 IMPLANT
SYR CONTROL 10ML LL (SYRINGE) ×3 IMPLANT
TOWEL OR 17X24 6PK STRL BLUE (TOWEL DISPOSABLE) ×6 IMPLANT
TRAY FOLEY CATH 14FR (SET/KITS/TRAYS/PACK) ×3 IMPLANT
WATER STERILE IRR 1000ML POUR (IV SOLUTION) ×3 IMPLANT

## 2014-05-30 NOTE — Op Note (Signed)
OPERATIVE NOTE   Dana [name]   DOB: 05/08/1980   MRN: [id number]   CSN: [id number]   Date of Surgery: 05/30/2014   Preoperative Diagnosis:   Postpartum day # 1  Desires Sterilization   Postoperative Diagnosis:   Same  Fibroid uterus  Procedure:   Modified [place] Postpartum Bilateral Tubal Sterilization Procedure   Surgeon:   Gildardo Cranker, M.D.   Assistant:   None   Anesthetic:   Epidural  Disposition:   The patient is a 34 y.o.-year-old female, now G2P2001, who desires sterilization. She understands the indications for surgical procedure. She accepts the risk of, but not limited to, anesthetic complications, bleeding, infections, possible damage to the surrounding organs, and possible tubal failure (17 per 1000).   Findings:   The fallopian tubes were normal bilaterally. The ovaries appeared normal. The patient had 3 subserosal fibroids on the anterior portion of the uterus and the fundus of the uterus all of which measured less than 2 cm in greatest diameter.   Procedure:   The patient was taken to the operating room where  Epidural anesthesia was noted to be adequate for surgery. The patient's abdomen was prepped with multiple layers of ChloraPrep. A foley catheter was placed after sterile preparation. She was then sterilely draped. The subumbilical area was injected with half percent Marcaine with epinephrine. A subumbilical incision was made and carried sharply through the subcutaneous tissue, the fascia, and the anterior peritoneum. The left fallopian tube was identified and followed to its fimbriated end. The mesosalpinx was clamped and cut removing the entire fallopian tube. A free-tie of 0 plain catgut and a suture ligature of 2-0 Vicryl were placed. Hemostasis was adequate. An identical procedure was carried out on the opposite side. Again hemostasis was adequate. The fascia and the anterior peritoneum were closed using a running suture of 2-0  Vicryl. The skin was reapproximated using a subcuticular suture of 3-0 Monocryl. Sponge, needle, and instrument counts were correct on 2 occasions. The estimated blood loss was 5 cc's. The patient tolerated her procedure well. She was transported to the recovery room in stable condition. The cut portions of the fallopian tubes were sent to pathology.   Gildardo Cranker, M.D.

## 2014-05-30 NOTE — Anesthesia Postprocedure Evaluation (Signed)
  Anesthesia Post-op Note  Patient: Dana [name]  Procedure(s) Performed: Procedure(s): POST PARTUM TUBAL LIGATION (Bilateral)  Patient Location: PACU  Anesthesia Type:Epidural  Level of Consciousness: awake, alert  and oriented  Airway and Oxygen Therapy: Patient Spontanous Breathing  Post-op Pain: none  Post-op Assessment: Post-op Vital signs reviewed, Patient's Cardiovascular Status Stable, Respiratory Function Stable, Patent Airway, No signs of Nausea or vomiting, Pain level controlled, No headache, No backache, No residual numbness and No residual motor weakness  Post-op Vital Signs: Reviewed and stable  Last Vitals:  Filed Vitals:   05/30/14 1130  BP: 114/70  Pulse: 74  Temp:   Resp: 20    Complications: No apparent anesthesia complications

## 2014-05-30 NOTE — Progress Notes (Signed)
UR chart review completed.  

## 2014-05-30 NOTE — Progress Notes (Signed)
Subjective: Postpartum Day 1: Vaginal delivery, 2nd degree laceration Patient up ad lib, reports no syncope or dizziness. Feeding:  Breast Contraceptive plan:  PPBTL scheduled today, NPO since MN  Objective: Vital signs in last 24 hours: Temp:  [98.1 F (36.7 C)-99.3 F (37.4 C)] 98.4 F (36.9 C) (01/25 0630) Pulse Rate:  [69-149] 82 (01/25 0630) Resp:  [18] 18 (01/25 0630) BP: (102-127)/(30-97) 121/75 mmHg (01/25 0630) SpO2:  [73 %-100 %] 100 % (01/25 0630)  Physical Exam:  General: alert Lochia: appropriate Uterine Fundus: firm Perineum: healing well DVT Evaluation: No evidence of DVT seen on physical exam. Negative Homan's sign.    Recent Labs  05/29/14 0630 05/30/14 0655  HGB 11.9* 11.6*  HCT 35.9* 35.8*    Assessment/Plan: Status post vaginal delivery day 1. Desires PPBTL--scheduled today  Stable Reviewed BTL process--patient ready to proceed. Continue current care. Plan for discharge tomorrow    Dana [name] 05/30/2014, 8:05 AM

## 2014-05-30 NOTE — Lactation Note (Signed)
This note was copied from the chart of [name]. Lactation Consultation Note  Patient Name: Dana [name] KCLEX'N Date: 05/30/2014 Reason for consult: Initial assessment;Difficult latch;Breast/nipple pain (especially left due to split center of nipple) This is mom's second baby.  She attempted breastfeeding her first son 6 years ago but only a few days, then gave up because "I didn't know enough" when he cried and how to increase milk supply.  LC discussed supply and demand and benefits of frequent STS, cue feedings, hand expression, stages of milk production and signs of proper latch.  Mom says she can tell when he is latched properly without nipple pain.  (R) nipple everts easily but (L) has slit which has become tender with shallow latching.  LC assisted mom with baby in sidelying football position, drops of ebm on nipple and mom shown breast support and breast compression to sandwich breast tissue more deeply into his mouth.  Initial few sucks were painful but eased quickly and rhythmical sucking bursts were seen for >20 minutes on this breast with audible swallows and no discomfort of mom's nipple.  LC provided sizes #20 (and #24) NS to try if soreness or latching problems persist.  FOB present and aware of how to assist.  Report given to RN, Vinson Moselle after feeding assessment.  Mom was provided with comfort gelpads to apply to nipples between feedings.  Breast and nipple care, including during engorgement, reviewed with mom. Mom encouraged to feed baby 8-12 times/24 hours and with feeding cues. LC encouraged review of Baby and Me pp 9, 14 and 20-25 for STS and BF information. LC provided [employer] Resource brochure and reviewed [facility] services and list of community and web site resources.    Maternal Data Formula Feeding for Exclusion: No Has patient been taught Hand Expression?: Yes (LC demonstrated hand expression and drops seen) Does the patient have breastfeeding experience prior to this  delivery?: Yes  Feeding Feeding Type: Breast Fed Length of feed: 20 min (sustained >20 minutes)  LATCH Score/Interventions Latch: Grasps breast easily, tongue down, lips flanged, rhythmical sucking. Intervention(s): Breast compression;Assist with latch;Adjust position  Audible Swallowing: Spontaneous and intermittent Intervention(s): Skin to skin;Hand expression  Type of Nipple: Everted at rest and after stimulation (left nipple split in center; tender)  Comfort (Breast/Nipple): Filling, red/small blisters or bruises, mild/mod discomfort  Problem noted: Mild/Moderate discomfort Interventions (Mild/moderate discomfort): Comfort gels;Hand expression;Pre-pump if needed  Hold (Positioning): Assistance needed to correctly position infant at breast and maintain latch. Intervention(s): Breastfeeding basics reviewed;Support Pillows;Position options;Skin to skin  LATCH Score: 8 (LC assisted and observed, on left breast with split nipple)  Lactation Tools Discussed/Used   STS, cue feedings, signs of proper latch, hand expression Comfort gelpads and nipple care, especially more tender (L) nipple Written and verbal instructions in use of NS (mom may try later, if needed)  Consult Status Consult Status: Follow-up Date: 05/31/14 Follow-up type: In-patient    Junious Dresser [facility] 05/30/2014, 10:43 PM

## 2014-05-30 NOTE — Anesthesia Postprocedure Evaluation (Signed)
Anesthesia Post Note  Patient: Dana [name]  Procedure(s) Performed: * No procedures listed *  Anesthesia type: Epidural  Patient location: Mother/Baby  Post pain: Pain level controlled  Post assessment: Post-op Vital signs reviewed  Last Vitals:  Filed Vitals:   05/30/14 1310  BP: 121/78  Pulse: 72  Temp: 36.4 C  Resp: 18    Post vital signs: Reviewed  Level of consciousness:alert  Complications: No apparent anesthesia complications

## 2014-05-30 NOTE — Transfer of Care (Signed)
Immediate Anesthesia Transfer of Care Note  Patient: Dana [name]  Procedure(s) Performed: Procedure(s): POST PARTUM TUBAL LIGATION (Bilateral)  Patient Location: PACU  Anesthesia Type:Epidural  Level of Consciousness: awake, alert , oriented and patient cooperative  Airway & Oxygen Therapy: Patient Spontanous Breathing  Post-op Assessment: Report given to PACU RN and Post -op Vital signs reviewed and stable  Post vital signs: Reviewed and stable  Complications: No apparent anesthesia complications

## 2014-05-30 NOTE — Progress Notes (Signed)
PROGRESS NOTE  I have reviewed the patient's vital signs, labs, and notes. I have examined the patient. I agree with the previous note from the Certified Nurse Midwife.  BTL discussed. R and B reviewed. Questions answered. Ready to proceed.  Circ discussed. R and B reviewed. Questions answered. Ready to proceed.  Gildardo Cranker, M.D. 05/30/2014

## 2014-05-31 ENCOUNTER — Encounter (HOSPITAL_COMMUNITY): Payer: Self-pay | Admitting: Obstetrics and Gynecology

## 2014-05-31 MED ORDER — IBUPROFEN 600 MG PO TABS: 600.0000 mg | ORAL_TABLET | Freq: Four times a day (QID) | ORAL | Status: DC | PRN

## 2014-05-31 MED ORDER — OXYCODONE-ACETAMINOPHEN 5-325 MG PO TABS: 1.0000 | ORAL_TABLET | ORAL | Status: DC | PRN

## 2014-05-31 NOTE — Discharge Summary (Signed)
  Vaginal Delivery Discharge Summary  Dana [name]  DOB:    16-Dec-1980 MRN:    [id number] CSN:    [id number]  Date of admission:                  05/29/14  Date of discharge:                   05/31/14  Procedures this admission:   SVB, repair of 2nd degree laceration, PP BTL  Date of Delivery: 05/29/14  Newborn Data:  Live born female  Birth Weight: 7 lb 8.1 oz (3405 g) APGAR: 9, 9  Home with mother. Name: [name] Circumcision Plan: Inpatient  History of Present Illness:  Ms. Dana [name] is a 34 y.o. female, G2P2001, who presents at [redacted]w[redacted]d weeks gestation. The patient has been followed at the [facility] and Gynecology division of [facility]. She was admitted onset of labor. Her pregnancy has been complicated by:  Patient Active Problem List   Diagnosis Date Noted  . Normal labor 05/29/2014  . Second-degree perineal laceration, with delivery 05/29/2014  . Velamentous insertion of umbilical cord 03/47/4259  . SVD (spontaneous vaginal delivery) 05/29/2014  . Fibroids, subserous - measuring 2x2 cm in left anterior corpus 11/01/2013  . Subchorionic hemorrhage in first trimester 11/01/2013  . Ventral hernia 09/04/2011     Hospital Course:  Admitted 05/29/14 in early labor. Negative GBS. Progressed with minimal pitocin augmentation.  Utilized epidural for pain management.  Delivery was performed by Gavin Pound, CNM, without complication. Patient and baby tolerated the procedure without difficulty, with 2nd degree perineal laceration noted. Infant status was stable and remained in room with mother.  Mother and infant then had an uncomplicated postpartum course, with breast feeding going well. Patient desired BTL, and this was performed on day 1 by Dr. Raphael Gibney without complication.  On pp day 2, Mom's physical exam was WNL, and she was discharged home in stable condition. She received adequate benefit from po pain medications and was d/c'd home  with Motrin and Percocet.   Feeding:  breast  Contraception:  bilateral tubal ligation  Discharge hemoglobin:  HEMOGLOBIN  Date Value Ref Range Status  05/30/2014 11.6* 12.0 - 15.0 g/dL Final   HCT  Date Value Ref Range Status  05/30/2014 35.8* 36.0 - 46.0 % Final    Discharge Physical Exam:   General: alert Lochia: appropriate Uterine Fundus: firm Incision: healing well DVT Evaluation: No evidence of DVT seen on physical exam. Negative Homan's sign.  Intrapartum Procedures: spontaneous vaginal delivery Postpartum Procedures: P.P. tubal ligation Complications-Operative and Postpartum: 2nd degree perineal laceration  Discharge Diagnoses: Term Pregnancy-delivered, mild thrombocytopenia  Discharge Information:  Activity:           pelvic rest Diet:                routine Medications: Ibuprofen and Percocet Condition:      stable Instructions:     Discharge to: home  Follow-up Information    Follow up with [facility] In 6 weeks.   Specialty:  Obstetrics and Gynecology   Why:  Call with any questions or concerns.   Contact information:   [address]  [place] [address] [phone]       Donnel Saxon [facility] 05/31/2014 7:13 AM

## 2014-05-31 NOTE — Lactation Note (Signed)
This note was copied from the chart of [place]. Lactation Consultation Note  Suggest parents undress him to his diaper to feed. Mother placed baby in fb position on left. Nipples tender and mother has comfort gels. Left nipple looks bifurcated.  Baby latched briefly and sucked a few times but w/ stimulation still sleepy. Reviewed massaging breasts during feeding. Discussed engorgement care and monitor voids/stools. Mom encouraged to feed baby 8-12 times/24 hours and with feeding cues.    Patient Name: Dana [name] [id number] Date: 05/31/2014 Reason for consult: Follow-up assessment   Maternal Data    Feeding Feeding Type: Breast Fed Length of feed: 10 min  LATCH Score/Interventions                      Lactation Tools Discussed/Used     Consult Status Consult Status: Complete    Carlye Grippe 05/31/2014, 11:10 AM

## 2014-05-31 NOTE — Discharge Instructions (Signed)
Postpartum Care After Vaginal Delivery °After you deliver your newborn (postpartum period), the usual stay in the hospital is 24-72 hours. If there were problems with your labor or delivery, or if you have other medical problems, you might be in the hospital longer.  °While you are in the hospital, you will receive help and instructions on how to care for yourself and your newborn during the postpartum period.  °While you are in the hospital: °· Be sure to tell your nurses if you have pain or discomfort, as well as where you feel the pain and what makes the pain worse. °· If you had an incision made near your vagina (episiotomy) or if you had some tearing during delivery, the nurses may put ice packs on your episiotomy or tear. The ice packs may help to reduce the pain and swelling. °· If you are breastfeeding, you may feel uncomfortable contractions of your uterus for a couple of weeks. This is normal. The contractions help your uterus get back to normal size. °· It is normal to have some bleeding after delivery. °· For the first 1-3 days after delivery, the flow is red and the amount may be similar to a period. °· It is common for the flow to start and stop. °· In the first few days, you may pass some small clots. Let your nurses know if you begin to pass large clots or your flow increases. °· Do not  flush blood clots down the toilet before having the nurse look at them. °· During the next 3-10 days after delivery, your flow should become more watery and pink or brown-tinged in color. °· Ten to fourteen days after delivery, your flow should be a small amount of yellowish-white discharge. °· The amount of your flow will decrease over the first few weeks after delivery. Your flow may stop in 6-8 weeks. Most women have had their flow stop by 12 weeks after delivery. °· You should change your sanitary pads frequently. °· Wash your hands thoroughly with soap and water for at least 20 seconds after changing pads, using  the toilet, or before holding or feeding your newborn. °· You should feel like you need to empty your bladder within the first 6-8 hours after delivery. °· In case you become weak, lightheaded, or faint, call your nurse before you get out of bed for the first time and before you take a shower for the first time. °· Within the first few days after delivery, your breasts may begin to feel tender and full. This is called engorgement. Breast tenderness usually goes away within 48-72 hours after engorgement occurs. You may also notice milk leaking from your breasts. If you are not breastfeeding, do not stimulate your breasts. Breast stimulation can make your breasts produce more milk. °· Spending as much time as possible with your newborn is very important. During this time, you and your newborn can feel close and get to know each other. Having your newborn stay in your room (rooming in) will help to strengthen the bond with your newborn.  It will give you time to get to know your newborn and become comfortable caring for your newborn. °· Your hormones change after delivery. Sometimes the hormone changes can temporarily cause you to feel sad or tearful. These feelings should not last more than a few days. If these feelings last longer than that, you should talk to your caregiver. °· If desired, talk to your caregiver about methods of family planning or contraception. °·   Talk to your caregiver about immunizations. Your caregiver may want you to have the following immunizations before leaving the hospital:  Tetanus, diphtheria, and pertussis (Tdap) or tetanus and diphtheria (Td) immunization. It is very important that you and your family (including grandparents) or others caring for your newborn are up-to-date with the Tdap or Td immunizations. The Tdap or Td immunization can help protect your newborn from getting ill.  Rubella immunization.  Varicella (chickenpox) immunization.  Influenza immunization. You should  receive this annual immunization if you did not receive the immunization during your pregnancy. Document Released: 02/17/2007 Document Revised: 01/15/2012 Document Reviewed: 12/18/2011 [facility] Patient Information 2015 [place], [place]. This information is not intended to replace advice given to you by your health care provider. Make sure you discuss any questions you have with your health care provider.  Postpartum Tubal Ligation Care After Refer to this sheet in the next few weeks. These instructions provide you with information on caring for yourself after your procedure. Your caregiver may also give you more specific instructions. Your treatment has been planned according to current medical practices, but problems sometimes occur. Call your caregiver if you have any problems or questions after your procedure. HOME CARE INSTRUCTIONS   Rest the remainder of the day.  Only take over-the-counter or prescription medicines for pain, discomfort, or fever as directed by your caregiver. Do not take aspirin. It can cause bleeding.  Gradually resume daily activities, diet, rest, driving, and work.  Avoid sexual intercourse for 2 weeks or as directed.  Do not drive while taking pain medicine.  Do not lift anything over 5 pounds for 2 weeks or as directed.  Only take showers, not baths, until you are seen by your caregiver.  Change bandages (dressings) as directed.  Take your temperature twice a day and record it.  Try to have help for the first 7-10 days for your household needs.  Return to your caregiver to get your stitches (sutures) removed and for follow-up visits as directed. SEEK MEDICAL CARE IF:   You have redness, swelling, or increasing pain in the wound.  You have drainage from the wound lasting longer than 1 day.  Your pain is getting worse.  You have a rash.  You become dizzy or lightheaded.  You have a reaction to your medicine.  You need stronger medicine or a change  in your pain medicine.  You notice a bad smell coming from the wound or dressing.  Your wound breaks open after the sutures have been removed.  You are constipated. SEEK IMMEDIATE MEDICAL CARE IF:   You faint.  You have a fever.  You have increasing abdominal pain.  You have severe pain in your shoulders.  You have bleeding or drainage from the suture sites or vagina following surgery.  You have shortness of breath or difficulty breathing.  You have chest or leg pain.  You have persistent nausea, vomiting, or diarrhea. MAKE SURE YOU:   Understand these instructions.  Watch your condition.  Get help right away if you are not doing well or get worse. Document Released: 10/22/2011 Document Reviewed: 10/22/2011 [facility] Patient Information 2015 [place]. This information is not intended to replace advice given to you by your health care provider. Make sure you discuss any questions you have with your health care provider.

## 2014-06-04 ENCOUNTER — Inpatient Hospital Stay (HOSPITAL_COMMUNITY): Payer: BLUE CROSS/BLUE SHIELD

## 2014-06-04 ENCOUNTER — Inpatient Hospital Stay (HOSPITAL_COMMUNITY)
Admission: AD | Admit: 2014-06-04 | Discharge: 2014-06-04 | Disposition: A | Discharge status: Home / Self Care | Payer: BLUE CROSS/BLUE SHIELD | Source: Ambulatory Visit | Attending: Emergency Medicine | Admitting: Emergency Medicine

## 2014-06-04 ENCOUNTER — Encounter (HOSPITAL_COMMUNITY): Payer: Self-pay | Admitting: Anesthesiology

## 2014-06-04 ENCOUNTER — Encounter (HOSPITAL_COMMUNITY): Payer: Self-pay

## 2014-06-04 DIAGNOSIS — R0600 Dyspnea, unspecified: Secondary | ICD-10-CM | POA: Diagnosis not present

## 2014-06-04 DIAGNOSIS — I517 Cardiomegaly: Secondary | ICD-10-CM

## 2014-06-04 DIAGNOSIS — O9089 Other complications of the puerperium, not elsewhere classified: Secondary | ICD-10-CM | POA: Insufficient documentation

## 2014-06-04 DIAGNOSIS — R001 Bradycardia, unspecified: Secondary | ICD-10-CM | POA: Diagnosis not present

## 2014-06-04 DIAGNOSIS — R0789 Other chest pain: Secondary | ICD-10-CM | POA: Diagnosis present

## 2014-06-04 DIAGNOSIS — R002 Palpitations: Secondary | ICD-10-CM

## 2014-06-04 LAB — CBC WITH DIFFERENTIAL/PLATELET
BASOS PCT: 0 % (ref 0–1)
Basophils Absolute: 0 10*3/uL (ref 0.0–0.1)
Eosinophils Absolute: 0.1 10*3/uL (ref 0.0–0.7)
Eosinophils Relative: 2 % (ref 0–5)
HCT: 36.2 % (ref 36.0–46.0)
Hemoglobin: 11.8 g/dL — ABNORMAL LOW (ref 12.0–15.0)
LYMPHS PCT: 22 % (ref 12–46)
Lymphs Abs: 1.3 10*3/uL (ref 0.7–4.0)
MCH: 28.9 pg (ref 26.0–34.0)
MCHC: 32.6 g/dL (ref 30.0–36.0)
MCV: 88.7 fL (ref 78.0–100.0)
MONOS PCT: 9 % (ref 3–12)
Monocytes Absolute: 0.5 10*3/uL (ref 0.1–1.0)
NEUTROS ABS: 4 10*3/uL (ref 1.7–7.7)
NEUTROS PCT: 67 % (ref 43–77)
Platelets: 156 10*3/uL (ref 150–400)
RBC: 4.08 MIL/uL (ref 3.87–5.11)
RDW: 14.6 % (ref 11.5–15.5)
WBC: 6 10*3/uL (ref 4.0–10.5)

## 2014-06-04 LAB — COMPREHENSIVE METABOLIC PANEL
ALT: 255 U/L — AB (ref 0–35)
ANION GAP: 4 — AB (ref 5–15)
AST: 136 U/L — AB (ref 0–37)
Albumin: 2.9 g/dL — ABNORMAL LOW (ref 3.5–5.2)
Alkaline Phosphatase: 92 U/L (ref 39–117)
BUN: 14 mg/dL (ref 6–23)
CO2: 27 mmol/L (ref 19–32)
CREATININE: 0.66 mg/dL (ref 0.50–1.10)
Calcium: 8.6 mg/dL (ref 8.4–10.5)
Chloride: 108 mmol/L (ref 96–112)
GFR calc Af Amer: >90 mL/min (ref >90)
GFR calc non Af Amer: >90 mL/min (ref >90)
GLUCOSE: 80 mg/dL (ref 70–99)
POTASSIUM: 4.1 mmol/L (ref 3.5–5.1)
Sodium: 139 mmol/L (ref 135–145)
TOTAL PROTEIN: 5.8 g/dL — AB (ref 6.0–8.3)
Total Bilirubin: 0.5 mg/dL (ref 0.3–1.2)

## 2014-06-04 LAB — BRAIN NATRIURETIC PEPTIDE: B Natriuretic Peptide: 324.2 pg/mL — ABNORMAL HIGH (ref 0.0–100.0)

## 2014-06-04 LAB — TROPONIN I: Troponin I: <0.03 ng/mL (ref <0.031)

## 2014-06-04 MED ORDER — IOHEXOL 350 MG/ML SOLN
75.0000 mL | Freq: Once | INTRAVENOUS | Status: AC | PRN
  Administered 2014-06-04: 75 mL via INTRAVENOUS

## 2014-06-04 MED ORDER — LACTATED RINGERS IV BOLUS (SEPSIS)
1000.0000 mL | Freq: Once | INTRAVENOUS | Status: AC
  Administered 2014-06-04: 1000 mL via INTRAVENOUS

## 2014-06-04 MED ORDER — LACTATED RINGERS IV BOLUS (SEPSIS): 1000.0000 mL | Freq: Once | INTRAVENOUS | Status: DC

## 2014-06-04 MED ORDER — MORPHINE SULFATE 4 MG/ML IJ SOLN
4.0000 mg | Freq: Once | INTRAMUSCULAR | Status: AC
  Administered 2014-06-04: 4 mg via INTRAVENOUS
  Filled 2014-06-04: qty 1

## 2014-06-04 NOTE — MAU Note (Signed)
Report called to [facility] charge RN. Accepting physician Dr. Ashok Cordia

## 2014-06-04 NOTE — MAU Note (Signed)
Andy from [facility] called regarding transport. Recommended that [facility] come get pt because another pt needed to go first.

## 2014-06-04 NOTE — MAU Note (Signed)
Also felt chest tightness/pressure before she laid down, pressure almost completely gone.

## 2014-06-04 NOTE — MAU Note (Signed)
EMS at bedside. Report and paperwork given. [facility]

## 2014-06-04 NOTE — ED Provider Notes (Signed)
CSN: [id number]     Arrival date & time 06/04/14  1235 History   First MD Initiated Contact with Patient 06/04/14 1651     Chief Complaint  Patient presents with  . Bradycardia     (Consider location/radiation/quality/duration/timing/severity/associated sxs/prior Treatment) HPI Comments: Patient is a G54P2 34 yo F PMHx significant for gestational DM, thyroid disease presenting to the ED for pressure intermittent today chest pressure with shortness of breath that began at 10:30 AM this morning. Checked pulse and was 48. Seen at [facility] hospital sent to ED for evaluation. Chest pain with EMS 3/10 pressure given 1 nitro SL pain improved 1/10 pressure. 6 days post partum, s/p SVD w/o complication. No early familial cardiac history. No hx of DVT or PE.      Patient is a 34 y.o. female presenting with palpitations. The history is provided by the patient.  Palpitations Palpitations quality:  Slow Onset quality:  Sudden Duration:  6 hours Timing:  Intermittent Progression:  Partially resolved Chronicity:  New Context: not anxiety, not appetite suppressants, not bronchodilators, not caffeine, not exercise, not hyperventilation, not illicit drugs, not nicotine and not stimulant use   Relieved by:  Nothing Worsened by:  Nothing tried Ineffective treatments:  None tried Associated symptoms: chest pressure, lower extremity edema (bilateral ankle improved while postpartum) and shortness of breath   Associated symptoms: no back pain, no chest pain, no cough, no diaphoresis, no dizziness, no hemoptysis, no nausea, no near-syncope, no numbness, no orthopnea, no PND, no syncope, no vomiting and no weakness   Risk factors: hypercoagulable state   Risk factors: no diabetes mellitus, no heart disease, no hx of atrial fibrillation, no hx of DVT, no hx of PE, no hx of thyroid disease, no hyperthyroidism, no OTC sinus medications and no stress     Past Medical History  Diagnosis Date  . Ventral hernia  09/04/2011  . Thyroid disease   . Diabetes mellitus without complication     gestational diabetes  . Gestational diabetes    Past Surgical History  Procedure Laterality Date  . Wisdom tooth extraction  2001  . Eye surgery  2006    lasik - both eyes  . Ventral hernia repair  09/13/2011    Procedure: HERNIA REPAIR VENTRAL ADULT;  Surgeon: Imogene Burn. Georgette Dover, MD;  Location: [place];  Service: General;  Laterality: N/A;  . Hernia repair    . Tubal ligation Bilateral 05/30/2014    Procedure: POST PARTUM TUBAL LIGATION;  Surgeon: Ena Dawley, MD;  Location: [facility];  Service: Gynecology;  Laterality: Bilateral;   Family History  Problem Relation Age of Onset  . Cancer Maternal Grandmother     lung  . Hypertension Maternal Grandmother   . Hypertension Mother   . Heart disease Maternal Grandfather    History  Substance Use Topics  . Smoking status: Never Smoker   . Smokeless tobacco: Never Used  . Alcohol Use: No   OB History    Gravida Para Term Preterm AB TAB SAB Ectopic Multiple Living   2 2 2       0 1     Review of Systems  Constitutional: Negative for diaphoresis.  Respiratory: Positive for shortness of breath. Negative for cough and hemoptysis.   Cardiovascular: Positive for palpitations. Negative for chest pain, orthopnea, syncope, PND and near-syncope.  Gastrointestinal: Negative for nausea, vomiting and abdominal pain.  Musculoskeletal: Negative for back pain.  Neurological: Negative for dizziness and numbness.  All other systems reviewed  and are negative.     Allergies  Review of patient's allergies indicates no known allergies.  Home Medications   Prior to Admission medications   Medication Sig Start Date End Date Taking? Authorizing Provider  docusate sodium (COLACE) 100 MG capsule Take 100 mg by mouth daily as needed.    Yes Historical Provider, MD  ibuprofen (ADVIL,MOTRIN) 600 MG tablet Take 1 tablet (600 mg total) by mouth every 6 (six)  hours as needed. 05/31/14  Yes Donnel Saxon, CNM  oxyCODONE-acetaminophen (PERCOCET/ROXICET) 5-325 MG per tablet Take 1 tablet by mouth every 4 (four) hours as needed (for pain scale less than 7). 05/31/14  Yes Donnel Saxon, CNM  Prenatal Vit-Fe Fumarate-FA (PRENATAL MULTIVITAMIN) TABS tablet Take 1 tablet by mouth daily.    Yes Historical Provider, MD   BP 149/81 mmHg  Pulse 59  Temp(Src) 98.8 F (37.1 C) (Oral)  Resp 16  Ht 5\' 5"  (1.651 m)  Wt 214 lb (97.07 kg)  BMI 35.61 kg/m2  SpO2 98%  Breastfeeding? No Physical Exam  Constitutional: She is oriented to person, place, and time. She appears well-developed and well-nourished. No distress.  HENT:  Head: Normocephalic and atraumatic.  Right Ear: External ear normal.  Left Ear: External ear normal.  Nose: Nose normal.  Mouth/Throat: Oropharynx is clear and moist.  Eyes: Conjunctivae are normal.  Neck: Normal range of motion. Neck supple.  Cardiovascular: Regular rhythm, normal heart sounds and intact distal pulses.  Bradycardia present.   Pulmonary/Chest: Effort normal and breath sounds normal. No respiratory distress.  Abdominal: Soft. Bowel sounds are normal. There is no tenderness. There is no rigidity, no rebound and no guarding.  Musculoskeletal: Normal range of motion. She exhibits edema (bilateral ankle 1+). She exhibits no tenderness.  Neurological: She is alert and oriented to person, place, and time.  Skin: Skin is warm and dry. She is not diaphoretic.  Psychiatric: She has a normal mood and affect.  Nursing note and vitals reviewed.   ED Course  Procedures (including critical care time) Medications  lactated ringers bolus 1,000 mL (0 mLs Intravenous Stopped 06/04/14 2112)  morphine 4 MG/ML injection 4 mg (4 mg Intravenous Given 06/04/14 1758)  iohexol (OMNIPAQUE) 350 MG/ML injection 75 mL (75 mLs Intravenous Contrast Given 06/04/14 1824)    Labs Review Labs Reviewed  CBC WITH DIFFERENTIAL/PLATELET - Abnormal; Notable  for the following:    Hemoglobin 11.8 (*)    All other components within normal limits  COMPREHENSIVE METABOLIC PANEL - Abnormal; Notable for the following:    Total Protein 5.8 (*)    Albumin 2.9 (*)    AST 136 (*)    ALT 255 (*)    Anion gap 4 (*)    All other components within normal limits  BRAIN NATRIURETIC PEPTIDE - Abnormal; Notable for the following:    B Natriuretic Peptide 324.2 (*)    All other components within normal limits  TROPONIN I  HEPATITIS PANEL, ACUTE    Imaging Review Dg Chest 2 View  06/04/2014   CLINICAL DATA:  Difficulty breathing and bradycardia. Mid sternal chest pressure for 1 day. Patient is 6 days postpartum  EXAM: CHEST  2 VIEW  COMPARISON:  Chest CT June 04, 2014  FINDINGS: The heart is mildly enlarged with pulmonary vascularity within normal limits. There is a small left effusion. There is no frank edema or consolidation. No adenopathy. No bone lesions.  IMPRESSION: Mild cardiomegaly with small left effusion. No edema or consolidation.  These findings  potentially may indicate that this patient is at risk for post partum cardiomyopathy. Appropriate evaluation in this regard advised.   Electronically Signed   By: Lowella Grip M.D.   On: 06/04/2014 19:04   Ct Angio Chest Pe W/cm &/or Wo Cm  06/04/2014   CLINICAL DATA:  Chest pain and shortness of breath for 2 days. The patient is 6 days postpartum.  EXAM: CT ANGIOGRAPHY CHEST WITH CONTRAST  TECHNIQUE: Multidetector CT imaging of the chest was performed using the standard protocol during bolus administration of intravenous contrast. Multiplanar CT image reconstructions and MIPs were obtained to evaluate the vascular anatomy.  CONTRAST:  75 mL OMNIPAQUE IOHEXOL 350 MG/ML SOLN  COMPARISON:  None.  FINDINGS: The study is somewhat limited due to bolus timing. No pulmonary embolus is identified. There are small bilateral pleural effusions. Heart size is mildly enlarged. No pericardial effusion is identified. No  axillary, hilar or mediastinal lymphadenopathy. The lungs demonstrate mild dependent atelectasis.  Visualized upper abdomen shows no focal abnormality. No focal bony abnormality is identified.  Review of the MIP images confirms the above findings.  IMPRESSION: Negative for pulmonary embolus.  Small bilateral pleural effusions.  Mild cardiomegaly.   Electronically Signed   By: Inge Rise M.D.   On: 06/04/2014 18:47     EKG Interpretation   Date/Time:  Saturday June 04 2014 17:00:07 EST Ventricular Rate:  58 PR Interval:  150 QRS Duration: 66 QT Interval:  419 QTC Calculation: 411 R Axis:   30 Text Interpretation:  Sinus rhythm Since last tracing rate faster  Confirmed by KNAPP  MD-J, JON ([id number]) on 06/04/2014 5:10:51 PM      Patient currently declines any pain medication.   Discussed patient case with Dr. Claiborne Billings of cardiology who agrees that patient is stable and appropriate for outpatient evaluation of cardiomegaly for possible cardiomyopathy. The office will call patient for scheduled follow up appointment.   MDM   Final diagnoses:  Palpitations  Cardiomegaly    Filed Vitals:   06/04/14 2045  BP: 149/81  Pulse: 59  Temp:   Resp: 16   Afebrile, NAD, non-toxic appearing, AAOx4.  I have reviewed nursing notes, vital signs, and all appropriate lab and imaging results for this patient. Patient presenting with CP and SOB six days post partum. Concern for possible PE vs cardiomyopathy given recent delivery. Mild bradycardia noted, along with trace bilateral ankle edema noted. Examination otherwise unremarkable. Troponin negative. EKG unremarkable. BNP mildly elevated. CXR with mild cardiomegaly and small pleural effusion. CT chest without evidence of PE. Given patient is hemodynamically stable in no acute distress feel she is safe for DC home with outpatient cardiology follow up for possible cardiomyopathy. Dr. Claiborne Billings of cardiology is in agreement with this plan. Extensive  return precautions discussed with the patient who prefers outpatient follow up. Patient is stable at time of discharge. Patient d/w with Dr. Tomi Bamberger, agrees with plan.       Harlow Mares, PA-C 06/04/14 2338  Dorie Rank, MD 06/08/14 [phone]

## 2014-06-04 NOTE — MAU Provider Note (Signed)
MAU Addendum Note Reviewed the plan of care with the patient.  Filed Vitals:   06/04/14 1440 06/04/14 1443 06/04/14 1448 06/04/14 1453  BP:      Pulse: 50 54 51 52  Temp:      TempSrc:      Resp:      Height:      Weight:      SpO2: 98% 98% 98% 98%   Spoke to Dr Ashok Cordia at [facility] ER EKG results: Sinus bradycardia, minimal voltage for LVH, boarderline ECG   Plan: txr to [facility] for cardiac work up Dr Ashok Cordia accepted transfer Dr Landry Mellow aware   Dana [name], CNM, MSN 06/04/2014. 3:05 PM

## 2014-06-04 NOTE — Discharge Instructions (Signed)
Please follow up with your primary care physician in 1-2 days. If you do not have one please call the [place] number listed above. Please follow up with the cardiology office to schedule a follow up appointment.  Please read all discharge instructions and return precautions.   Cardiomyopathy Cardiomyopathy means a disease of the heart muscle. The heart muscle becomes enlarged or stiff. The heart is not able to pump enough blood or deliver enough oxygen to the body. This leads to heart failure and is the number one reason for heart transplants.  TYPES OF CARDIOMYOPATHY INCLUDE: DILATED  The most common type. The heart muscle is stretched out and weak so there is less blood pumped out.   Some causes:  Disease of the arteries of the heart (ischemia).  Heart attack with muscle scar.  Leaky or damaged valves.  After a viral illness.  Smoking.  High cholesterol.  Diabetes or overactive thyroid.  Alcohol or drug abuse.  High blood pressure.  May be reversible. HYPERTROPHIC The heart muscle grows bigger so there is less room for blood in the ventricle, and not enough blood is pumped out.   Causes include:  Mitral valve leaks.  Inherited tendency (from your family).  No explanation (idiopathic).  May be a cause of sudden death in young athletes with no symptoms. RESTRICTIVE The heart muscle becomes stiff, but not always larger. The heart has to work harder and will get weaker. Abnormal heart beats or rhythm (arrhythmia) are common.  Some causes:  Diseases in other parts of the body which may produce abnormal deposits in the heart muscle.  Probably not inherited.  A result of radiation treatment for cancer. SYMPTOMS OF ALL TYPES:  Less able to exercise or tolerate physical activity.  Palpitations.  Irregular heart beat, heart arrhythmias.  Shortness of breath, even at rest.  Chest pain.  Lightheadedness or  fainting. TREATMENT  Life-style changes including reducing salt, lowering cholesterol, stop smoking.  Manage contributing causes with medications.  Medicines to help reduce the fluids in the body.  An implanted cardioverter defibrillator (ICD) to improve heart function and correct arrhythmias.  Medications to relax the blood vessels and make it easier for the heart to pump.  Drugs that help regulate heart beat and improve heart relaxation, reducing the work of the heart.  Myomectomy for patients with hypertrophic cardiomyopathy and severe problems. This is a surgical procedure that removes a portion of the thickened muscle wall in order to improve heart output and provide symptom relief.  A heart transplant is an option in carefully applied circumstances. SEEK IMMEDIATE MEDICAL CARE IF:   You have severe chest pain, especially if the pain is crushing or pressure-like and spreads to the arms, back, neck, or jaw, or if you have sweating, feeling sick to your stomach (nausea), or shortness of breath. THIS IS AN EMERGENCY. Do not wait to see if the pain will go away. Get medical help at once. Call your local emergency services (911 in [place] DO NOT drive yourself to the hospital.  You develop severe shortness of breath.  You begin to cough up bloody sputum.  You are unable to sleep because you cannot breathe.  You gain weight due to fluid retention.  You develop painful swelling in your calf or leg.  You feel your heart racing and it does not go away or happens when you are resting. Document Released: 07/05/2004 Document Revised: 07/15/2011 Document Reviewed: 12/09/2007 [facility] Patient Information 2015 [place], [place].  This information is not intended to replace advice given to you by your health care provider. Make sure you discuss any questions you have with your health care provider. ° °

## 2014-06-04 NOTE — MAU Provider Note (Signed)
Dana [name] is a 34 y.o. G2P2001 non pregnant SP SVD 05/29/14.  Today she c/o of heart palpation, low heart rate, and chest discomfort and tightness, not pain.  She also reports she has not had any help from friends and family at home after delivery and therefore is not eating well bc of abd pain and not being about to stand to cook.   History     Patient Active Problem List   Diagnosis Date Noted  . Normal labor 05/29/2014  . Second-degree perineal laceration, with delivery 05/29/2014  . Velamentous insertion of umbilical cord 62/95/2841  . SVD (spontaneous vaginal delivery) 05/29/2014  . Fibroids, subserous - measuring 2x2 cm in left anterior corpus 11/01/2013  . Subchorionic hemorrhage in first trimester 11/01/2013  . Ventral hernia 09/04/2011    Chief Complaint  Patient presents with  . Bradycardia   HPI  OB History    Gravida Para Term Preterm AB TAB SAB Ectopic Multiple Living   2 2 2       0 1      Past Medical History  Diagnosis Date  . Ventral hernia 09/04/2011  . Thyroid disease   . Diabetes mellitus without complication     gestational diabetes  . Gestational diabetes     Past Surgical History  Procedure Laterality Date  . Wisdom tooth extraction  2001  . Eye surgery  2006    lasik - both eyes  . Ventral hernia repair  09/13/2011    Procedure: HERNIA REPAIR VENTRAL ADULT;  Surgeon: Dana Burn. Georgette Dover, MD;  Location: [place];  Service: General;  Laterality: N/A;  . Hernia repair    . Tubal ligation Bilateral 05/30/2014    Procedure: POST PARTUM TUBAL LIGATION;  Surgeon: Dana Dawley, MD;  Location: [facility];  Service: Gynecology;  Laterality: Bilateral;    Family History  Problem Relation Age of Onset  . Cancer Maternal Grandmother     lung  . Hypertension Maternal Grandmother   . Hypertension Mother   . Heart disease Maternal Grandfather     History  Substance Use Topics  . Smoking status: Never Smoker   . Smokeless tobacco:  Never Used  . Alcohol Use: No    Allergies: No Known Allergies  Prescriptions prior to admission  Medication Sig Dispense Refill Last Dose  . docusate sodium (COLACE) 100 MG capsule Take 100 mg by mouth daily as needed.    06/04/2014 at Unknown time  . ibuprofen (ADVIL,MOTRIN) 600 MG tablet Take 1 tablet (600 mg total) by mouth every 6 (six) hours as needed. 30 tablet 2 06/04/2014 at Unknown time  . oxyCODONE-acetaminophen (PERCOCET/ROXICET) 5-325 MG per tablet Take 1 tablet by mouth every 4 (four) hours as needed (for pain scale less than 7). 30 tablet 0 06/03/2014 at Unknown time  . Prenatal Vit-Fe Fumarate-FA (PRENATAL MULTIVITAMIN) TABS tablet Take 1 tablet by mouth daily.    06/04/2014 at Unknown time    ROS See HPI above, all other systems are negative  Physical Exam   Blood pressure 146/80, pulse 51, temperature 98.8 F (37.1 C), temperature source Oral, resp. rate 18, height 5\' 5"  (1.651 m), weight 214 lb 8 oz (97.297 kg), SpO2 98 %, unknown if currently breastfeeding.  Physical Exam Ext:  WNL ABD: Soft, non tender to palpation, no rebound or guarding SVE: deferred   ED Course  Assessment: Bradycardia  Filed Vitals:   06/04/14 1320 06/04/14 1325 06/04/14 1330 06/04/14 1335  BP:  Pulse: 51 54 51 51  Temp:      TempSrc:      Resp:      Height:      Weight:      SpO2: 99% 99% 98% 98%   BP 146/80  Plan:  Labs: CBC, CMP Consult with Cardiac Possible sent to [facility] for eval Consult with Dr. Zada Finders Captain [name], CNM, MSN 06/04/2014. 2:54 PM

## 2014-06-04 NOTE — ED Notes (Signed)
Patient states chest pain increasing to 4-5/10 pressure. Provider notified.

## 2014-06-04 NOTE — ED Notes (Signed)
EKG completed by EMT and given to EDP.

## 2014-06-04 NOTE — MAU Note (Signed)
Andy with [facility] called again to see ETA for pt. 20-30 minutes for truck to arrive

## 2014-06-04 NOTE — MAU Note (Signed)
Report called to [facility] for transfer.

## 2014-06-04 NOTE — MAU Note (Signed)
Pt states had a lot to do today and began feeling short of breath. Laid down and noticed her hr was low (mid-40's). Recounted HR and was then in 50's.

## 2014-06-04 NOTE — ED Notes (Signed)
Onset one day ago chest pressure intermittent today chest pressure. Checked pulse and was 48.  Seen at [facility] hospital sent to ED for evaluation. Chest pain with EMS 3/10 pressure given 1 nitro SL pain improved 1/10 pressure. 6 days post partum.

## 2014-06-05 ENCOUNTER — Inpatient Hospital Stay (HOSPITAL_COMMUNITY)
Admission: EM | Admit: 2014-06-05 | Discharge: 2014-06-07 | DRG: 776 | Disposition: A | Discharge status: Home / Self Care | Payer: BLUE CROSS/BLUE SHIELD | Attending: Obstetrics and Gynecology | Admitting: Obstetrics and Gynecology

## 2014-06-05 ENCOUNTER — Emergency Department (HOSPITAL_COMMUNITY): Payer: BLUE CROSS/BLUE SHIELD

## 2014-06-05 ENCOUNTER — Encounter (HOSPITAL_COMMUNITY): Payer: Self-pay | Admitting: *Deleted

## 2014-06-05 ENCOUNTER — Other Ambulatory Visit: Payer: Self-pay | Admitting: Physician Assistant

## 2014-06-05 DIAGNOSIS — E079 Disorder of thyroid, unspecified: Secondary | ICD-10-CM | POA: Diagnosis present

## 2014-06-05 DIAGNOSIS — O1490 Unspecified pre-eclampsia, unspecified trimester: Secondary | ICD-10-CM | POA: Diagnosis not present

## 2014-06-05 DIAGNOSIS — R51 Headache: Secondary | ICD-10-CM

## 2014-06-05 DIAGNOSIS — Z8632 Personal history of gestational diabetes: Secondary | ICD-10-CM

## 2014-06-05 DIAGNOSIS — R519 Headache, unspecified: Secondary | ICD-10-CM

## 2014-06-05 DIAGNOSIS — O99285 Endocrine, nutritional and metabolic diseases complicating the puerperium: Secondary | ICD-10-CM | POA: Diagnosis present

## 2014-06-05 DIAGNOSIS — R0600 Dyspnea, unspecified: Secondary | ICD-10-CM

## 2014-06-05 DIAGNOSIS — O9089 Other complications of the puerperium, not elsewhere classified: Principal | ICD-10-CM | POA: Diagnosis present

## 2014-06-05 DIAGNOSIS — O1495 Unspecified pre-eclampsia, complicating the puerperium: Secondary | ICD-10-CM | POA: Diagnosis present

## 2014-06-05 LAB — URINALYSIS, ROUTINE W REFLEX MICROSCOPIC
Bilirubin Urine: NEGATIVE
GLUCOSE, UA: NEGATIVE mg/dL
Ketones, ur: 15 mg/dL — AB
Nitrite: NEGATIVE
PROTEIN: 30 mg/dL — AB
Specific Gravity, Urine: 1.01 (ref 1.005–1.030)
Urobilinogen, UA: 1 mg/dL (ref 0.0–1.0)
pH: 7.5 (ref 5.0–8.0)

## 2014-06-05 LAB — COMPREHENSIVE METABOLIC PANEL
ALBUMIN: 3 g/dL — AB (ref 3.5–5.2)
ALT: 365 U/L — ABNORMAL HIGH (ref 0–35)
AST: 143 U/L — ABNORMAL HIGH (ref 0–37)
Alkaline Phosphatase: 115 U/L (ref 39–117)
Anion gap: 7 (ref 5–15)
BUN: 9 mg/dL (ref 6–23)
CALCIUM: 8.6 mg/dL (ref 8.4–10.5)
CO2: 27 mmol/L (ref 19–32)
Chloride: 103 mmol/L (ref 96–112)
Creatinine, Ser: 0.78 mg/dL (ref 0.50–1.10)
GFR calc Af Amer: >90 mL/min (ref >90)
GFR calc non Af Amer: >90 mL/min (ref >90)
Glucose, Bld: 82 mg/dL (ref 70–99)
POTASSIUM: 3.7 mmol/L (ref 3.5–5.1)
Sodium: 137 mmol/L (ref 135–145)
Total Bilirubin: 0.6 mg/dL (ref 0.3–1.2)
Total Protein: 5.9 g/dL — ABNORMAL LOW (ref 6.0–8.3)

## 2014-06-05 LAB — PROTEIN / CREATININE RATIO, URINE
Creatinine, Urine: 37.31 mg/dL
Protein Creatinine Ratio: 1.21 — ABNORMAL HIGH (ref 0.00–0.15)
Total Protein, Urine: 45 mg/dL

## 2014-06-05 LAB — CBC
HCT: 42.3 % (ref 36.0–46.0)
Hemoglobin: 14 g/dL (ref 12.0–15.0)
MCH: 28.9 pg (ref 26.0–34.0)
MCHC: 33.1 g/dL (ref 30.0–36.0)
MCV: 87.4 fL (ref 78.0–100.0)
Platelets: 184 10*3/uL (ref 150–400)
RBC: 4.84 MIL/uL (ref 3.87–5.11)
RDW: 14.4 % (ref 11.5–15.5)
WBC: 7.8 10*3/uL (ref 4.0–10.5)

## 2014-06-05 LAB — URINE MICROSCOPIC-ADD ON

## 2014-06-05 MED ORDER — HYDRALAZINE HCL 20 MG/ML IJ SOLN
5.0000 mg | INTRAMUSCULAR | Status: AC
  Administered 2014-06-05: 5 mg via INTRAVENOUS
  Filled 2014-06-05: qty 1

## 2014-06-05 MED ORDER — MAGNESIUM SULFATE 2 GM/50ML IV SOLN
2.0000 g | INTRAVENOUS | Status: DC
  Administered 2014-06-06: 2 g via INTRAVENOUS
  Filled 2014-06-05 (×17): qty 50

## 2014-06-05 MED ORDER — MAGNESIUM SULFATE 4 GM/100ML IV SOLN
4.0000 g | INTRAVENOUS | Status: AC
  Administered 2014-06-05: 4 g via INTRAVENOUS
  Filled 2014-06-05: qty 100

## 2014-06-05 MED ORDER — PROCHLORPERAZINE EDISYLATE 5 MG/ML IJ SOLN
10.0000 mg | Freq: Once | INTRAMUSCULAR | Status: AC
  Administered 2014-06-05: 10 mg via INTRAVENOUS
  Filled 2014-06-05: qty 2

## 2014-06-05 MED ORDER — HYDROCODONE-ACETAMINOPHEN 5-325 MG PO TABS
2.0000 | ORAL_TABLET | Freq: Once | ORAL | Status: AC
  Administered 2014-06-05: 2 via ORAL
  Filled 2014-06-05: qty 2

## 2014-06-05 MED ORDER — DIPHENHYDRAMINE HCL 50 MG/ML IJ SOLN
25.0000 mg | Freq: Once | INTRAMUSCULAR | Status: AC
  Administered 2014-06-05: 25 mg via INTRAVENOUS
  Filled 2014-06-05: qty 1

## 2014-06-05 NOTE — ED Notes (Signed)
[facility] contacted for transport to [facility]

## 2014-06-05 NOTE — ED Notes (Signed)
Pt reports HA starting today. Pt also reports high blood pressure. Pt states she took her blood pressure today with an average reading of 270'W systolic. Pt denies seeing spots, blurry vision, lightheadedness, dizziness. Pt is 7 days postpartum. Pt was seen here yesterday for sinus brady and chest pressure.

## 2014-06-05 NOTE — ED Provider Notes (Addendum)
CSN: [id number]     Arrival date & time 06/05/14  26 History   First MD Initiated Contact with Patient 06/05/14 1801     Chief Complaint  Patient presents with  . Headache     (Consider location/radiation/quality/duration/timing/severity/associated sxs/prior Treatment) The history is provided by the patient and medical records. No language interpreter was used.     Dana [name] is a 34 y.o. female  G40P2 with a hx of gestational DM, thyroid disease presents to the Emergency Department complaining of gradual, persistent, progressively worsening headache onset this morning after waking.  Patient reports that his generalized, throbbing, rated at a 10 out of 10 in the worst headache of her life. She reports that after her headache began she took some Tylenol and went back to sleep when she awoke her headache was worse. She reports it is continued to worsen throughout the day.. Patient is 7 days postpartum, status post spontaneous vaginal delivery without complication.   Patient is without other associated symptoms. She has no chest pain or shortness of breath today.  Patient was seen yesterday for chest pain, shortness of breath and palpitations. She was seen yesterday at [facility] and sent to [facility] for further evaluation.  Patient was with a history of DVT or PE and no PE was found yesterday. Patient with mild cardiomegaly found with concern for possible Cardiomyopathy. Patient was to follow with Dr. Claiborne Billings of cardiology in the outpatient setting.  Past Medical History  Diagnosis Date  . Ventral hernia 09/04/2011  . Thyroid disease   . Diabetes mellitus without complication     gestational diabetes  . Gestational diabetes    Past Surgical History  Procedure Laterality Date  . Wisdom tooth extraction  2001  . Eye surgery  2006    lasik - both eyes  . Ventral hernia repair  09/13/2011    Procedure: HERNIA REPAIR VENTRAL ADULT;  Surgeon: Imogene Burn. Georgette Dover, MD;  Location: [place];  Service: General;  Laterality: N/A;  . Hernia repair    . Tubal ligation Bilateral 05/30/2014    Procedure: POST PARTUM TUBAL LIGATION;  Surgeon: Ena Dawley, MD;  Location: [facility];  Service: Gynecology;  Laterality: Bilateral;   Family History  Problem Relation Age of Onset  . Cancer Maternal Grandmother     lung  . Hypertension Maternal Grandmother   . Hypertension Mother   . Heart disease Maternal Grandfather    History  Substance Use Topics  . Smoking status: Never Smoker   . Smokeless tobacco: Never Used  . Alcohol Use: No   OB History    Gravida Para Term Preterm AB TAB SAB Ectopic Multiple Living   2 2 2       0 1     Review of Systems  Constitutional: Negative for fever, diaphoresis, appetite change, fatigue and unexpected weight change.  HENT: Negative for mouth sores.   Eyes: Negative for visual disturbance.  Respiratory: Negative for cough, chest tightness, shortness of breath and wheezing.   Cardiovascular: Negative for chest pain.  Gastrointestinal: Negative for nausea, vomiting, abdominal pain, diarrhea and constipation.  Endocrine: Negative for polydipsia, polyphagia and polyuria.  Genitourinary: Negative for dysuria, urgency, frequency and hematuria.  Musculoskeletal: Negative for back pain and neck stiffness.  Skin: Negative for rash.  Allergic/Immunologic: Negative for immunocompromised state.  Neurological: Positive for headaches. Negative for syncope and light-headedness.  Hematological: Does not bruise/bleed easily.  Psychiatric/Behavioral: Negative for sleep disturbance. The patient is not  nervous/anxious.       Allergies  Review of patient's allergies indicates no known allergies.  Home Medications   Prior to Admission medications   Medication Sig Start Date End Date Taking? Authorizing Provider  acetaminophen (TYLENOL) 500 MG tablet Take 1,000 mg by mouth every 6 (six) hours as needed (pain).   Yes Historical Provider,  MD  docusate sodium (COLACE) 100 MG capsule Take 100 mg by mouth daily as needed (constipation).    Yes Historical Provider, MD  ibuprofen (ADVIL,MOTRIN) 600 MG tablet Take 1 tablet (600 mg total) by mouth every 6 (six) hours as needed. Patient taking differently: Take 600 mg by mouth every 6 (six) hours as needed (pain).  05/31/14  Yes Donnel Saxon, CNM  oxyCODONE-acetaminophen (PERCOCET/ROXICET) 5-325 MG per tablet Take 1 tablet by mouth every 4 (four) hours as needed (for pain scale less than 7). 05/31/14  Yes Donnel Saxon, CNM  Prenatal Vit-Fe Fumarate-FA (PRENATAL MULTIVITAMIN) TABS tablet Take 1 tablet by mouth daily.    Yes Historical Provider, MD   BP 163/87 mmHg  Pulse 69  Temp(Src) 98.4 F (36.9 C)  Resp 20  Ht 5\' 5"  (1.651 m)  Wt 214 lb (97.07 kg)  BMI 35.61 kg/m2  SpO2 100%  LMP  Physical Exam  Constitutional: She is oriented to person, place, and time. She appears well-developed and well-nourished. No distress.  HENT:  Head: Normocephalic and atraumatic.  Right Ear: Tympanic membrane, external ear and ear canal normal.  Left Ear: Tympanic membrane, external ear and ear canal normal.  Nose: Nose normal. No epistaxis. Right sinus exhibits no maxillary sinus tenderness and no frontal sinus tenderness. Left sinus exhibits no maxillary sinus tenderness and no frontal sinus tenderness.  Mouth/Throat: Uvula is midline, oropharynx is clear and moist and mucous membranes are normal. Mucous membranes are not pale and not cyanotic. No oropharyngeal exudate, posterior oropharyngeal edema, posterior oropharyngeal erythema or tonsillar abscesses.  Eyes: Conjunctivae and EOM are normal. Pupils are equal, round, and reactive to light. No scleral icterus.  No horizontal, vertical or rotational nystagmus  Neck: Normal range of motion and full passive range of motion without pain. Neck supple.  Full active and passive ROM without pain No midline or paraspinal tenderness No nuchal rigidity or  meningeal signs  Cardiovascular: Normal rate, regular rhythm, normal heart sounds and intact distal pulses.   No murmur heard. Pulmonary/Chest: Effort normal and breath sounds normal. No stridor. No respiratory distress. She has no wheezes. She has no rales.  Clear and equal breath sounds without focal wheezes, rhonchi, rales  Abdominal: Soft. Bowel sounds are normal. There is no tenderness. There is no rebound and no guarding.  Soft, nontender  Musculoskeletal: Normal range of motion.  Lymphadenopathy:    She has no cervical adenopathy.  Neurological: She is alert and oriented to person, place, and time. She has normal reflexes. No cranial nerve deficit. She exhibits normal muscle tone. Coordination normal.  Mental Status:  Alert, oriented, thought content appropriate. Speech fluent without evidence of aphasia. Able to follow 2 step commands without difficulty.  Cranial Nerves:  II:  Peripheral visual fields grossly normal, pupils equal, round, reactive to light III,IV, VI: ptosis not present, extra-ocular motions intact bilaterally  V,VII: smile symmetric, facial light touch sensation equal VIII: hearing grossly normal bilaterally  IX,X: gag reflex present  XI: bilateral shoulder shrug equal and strong XII: midline tongue extension  Motor:  5/5 in upper and lower extremities bilaterally including strong and equal grip strength  and dorsiflexion/plantar flexion Sensory: Pinprick and light touch normal in all extremities.  Deep Tendon Reflexes: 2+ and symmetric  Cerebellar: normal finger-to-nose with bilateral upper extremities Gait: normal gait and balance CV: distal pulses palpable throughout   Skin: Skin is warm and dry. No rash noted. She is not diaphoretic. No erythema.  Psychiatric: She has a normal mood and affect. Her behavior is normal. Judgment and thought content normal.  Nursing note and vitals reviewed.   ED Course  Procedures (including critical care time) Labs  Review Labs Reviewed  URINALYSIS, ROUTINE W REFLEX MICROSCOPIC - Abnormal; Notable for the following:    Hgb urine dipstick LARGE (*)    Ketones, ur 15 (*)    Protein, ur 30 (*)    Leukocytes, UA TRACE (*)    All other components within normal limits  COMPREHENSIVE METABOLIC PANEL - Abnormal; Notable for the following:    Total Protein 5.9 (*)    Albumin 3.0 (*)    AST 143 (*)    ALT 365 (*)    All other components within normal limits  URINE MICROSCOPIC-ADD ON - Abnormal; Notable for the following:    Squamous Epithelial / LPF FEW (*)    Bacteria, UA FEW (*)    All other components within normal limits  CBC  PROTEIN / CREATININE RATIO, URINE    Imaging Review Dg Chest 2 View  06/04/2014   CLINICAL DATA:  Difficulty breathing and bradycardia. Mid sternal chest pressure for 1 day. Patient is 6 days postpartum  EXAM: CHEST  2 VIEW  COMPARISON:  Chest CT June 04, 2014  FINDINGS: The heart is mildly enlarged with pulmonary vascularity within normal limits. There is a small left effusion. There is no frank edema or consolidation. No adenopathy. No bone lesions.  IMPRESSION: Mild cardiomegaly with small left effusion. No edema or consolidation.  These findings potentially may indicate that this patient is at risk for post partum cardiomyopathy. Appropriate evaluation in this regard advised.   Electronically Signed   By: Lowella Grip M.D.   On: 06/04/2014 19:04   Ct Head Wo Contrast  06/05/2014   CLINICAL DATA:  Acute onset severe headache  EXAM: CT HEAD WITHOUT CONTRAST  TECHNIQUE: Contiguous axial images were obtained from the base of the skull through the vertex without intravenous contrast.  COMPARISON:  None.  FINDINGS: The ventricles are normal in size and configuration. There is no mass, hemorrhage, extra-axial fluid collection, or midline shift. Gray-white compartments are normal. No acute infarct apparent. The bony calvarium appears intact. The visualized mastoid air cells are  clear.  IMPRESSION: Study within normal limits.   Electronically Signed   By: Lowella Grip M.D.   On: 06/05/2014 19:26   Ct Angio Chest Pe W/cm &/or Wo Cm  06/04/2014   CLINICAL DATA:  Chest pain and shortness of breath for 2 days. The patient is 6 days postpartum.  EXAM: CT ANGIOGRAPHY CHEST WITH CONTRAST  TECHNIQUE: Multidetector CT imaging of the chest was performed using the standard protocol during bolus administration of intravenous contrast. Multiplanar CT image reconstructions and MIPs were obtained to evaluate the vascular anatomy.  CONTRAST:  75 mL OMNIPAQUE IOHEXOL 350 MG/ML SOLN  COMPARISON:  None.  FINDINGS: The study is somewhat limited due to bolus timing. No pulmonary embolus is identified. There are small bilateral pleural effusions. Heart size is mildly enlarged. No pericardial effusion is identified. No axillary, hilar or mediastinal lymphadenopathy. The lungs demonstrate mild dependent atelectasis.  Visualized upper  abdomen shows no focal abnormality. No focal bony abnormality is identified.  Review of the MIP images confirms the above findings.  IMPRESSION: Negative for pulmonary embolus.  Small bilateral pleural effusions.  Mild cardiomegaly.   Electronically Signed   By: Inge Rise M.D.   On: 06/04/2014 18:47     EKG Interpretation None      MDM   Final diagnoses:  Severe headache  Postpartum headache  Preeclampsia in postpartum period, unspecified trimester   Jennah L Streed presents with severe headache 7 days postpartum. Record review shows patient with elevated hepatic enzymes yesterday and low platelets last week. Concern for  HELLP versus postpartum preeclampsia. Patient significantly hypertensive here in the emergency department at this time. Will give medication for her headache and hypertension. Will obtain CT scan and repeat labs.  7:05 PM CBC with adequate platelets, urinalysis with protein and trace leukocytes. CMP pending.   7:20PM Patient's  blood pressure has decreased slightly, but remains with a MAP > 100.  Will redose hydralazine.  AST and ALT have increased since yesterday.  Pt's headache persists.  CT head pending  8:15PM CT head unremarkable.  Pt's headache persists.  Will give vicodin for pain control.    9:23 PM Discussed with on call Janett Billow, CNM at [place].  Will give Mag 4g.  Pt will be transferred to the AICU at [facility] and admitted to Dr. Christophe Louis.   Pt going to room 373 in the AICU.  BP 163/87 mmHg  Pulse 69  Temp(Src) 98.4 F (36.9 C)  Resp 20  Ht 5\' 5"  (1.651 m)  Wt 214 lb (97.07 kg)  BMI 35.61 kg/m2  SpO2 100%  LMP    Abigail Butts, PA-C 06/05/14 2132  Abigail Butts, PA-C 06/05/14 2134  Dot Lanes, MD 06/05/14 2249   11:33 PM Pt has not been transferred due to [facility] shortage.  MgSO4 ordered for Q 1 hr.  Abigail Butts, PA-C 06/05/14 [place], MD 06/05/14 [phone]

## 2014-06-06 ENCOUNTER — Encounter (HOSPITAL_COMMUNITY): Payer: Self-pay | Admitting: *Deleted

## 2014-06-06 DIAGNOSIS — Z8632 Personal history of gestational diabetes: Secondary | ICD-10-CM | POA: Diagnosis not present

## 2014-06-06 DIAGNOSIS — O99285 Endocrine, nutritional and metabolic diseases complicating the puerperium: Secondary | ICD-10-CM | POA: Diagnosis present

## 2014-06-06 DIAGNOSIS — O9089 Other complications of the puerperium, not elsewhere classified: Secondary | ICD-10-CM | POA: Diagnosis present

## 2014-06-06 DIAGNOSIS — O1495 Unspecified pre-eclampsia, complicating the puerperium: Secondary | ICD-10-CM | POA: Diagnosis present

## 2014-06-06 DIAGNOSIS — E079 Disorder of thyroid, unspecified: Secondary | ICD-10-CM | POA: Diagnosis present

## 2014-06-06 DIAGNOSIS — O1490 Unspecified pre-eclampsia, unspecified trimester: Secondary | ICD-10-CM | POA: Diagnosis present

## 2014-06-06 LAB — MRSA PCR SCREENING: MRSA by PCR: NEGATIVE

## 2014-06-06 MED ORDER — HYDRALAZINE HCL 20 MG/ML IJ SOLN: 10.0000 mg | INTRAMUSCULAR | Status: DC | PRN

## 2014-06-06 MED ORDER — MAGNESIUM SULFATE 40 G IN LACTATED RINGERS - SIMPLE
2.0000 g/h | INTRAVENOUS | Status: AC
  Administered 2014-06-06: 2 g/h via INTRAVENOUS
  Filled 2014-06-06 (×2): qty 500

## 2014-06-06 MED ORDER — DIPHENHYDRAMINE HCL 25 MG PO CAPS
25.0000 mg | ORAL_CAPSULE | Freq: Four times a day (QID) | ORAL | Status: DC | PRN
Start: 2014-06-06 — End: 2014-06-07

## 2014-06-06 MED ORDER — ONDANSETRON HCL 4 MG PO TABS: 4.0000 mg | ORAL_TABLET | ORAL | Status: DC | PRN

## 2014-06-06 MED ORDER — IBUPROFEN 600 MG PO TABS
600.0000 mg | ORAL_TABLET | Freq: Four times a day (QID) | ORAL | Status: DC
Start: 2014-06-06 — End: 2014-06-07
  Administered 2014-06-06 – 2014-06-07 (×5): 600 mg via ORAL
  Filled 2014-06-06 (×6): qty 1

## 2014-06-06 MED ORDER — SODIUM CHLORIDE 0.9 % IV SOLN
INTRAVENOUS | Status: AC
  Administered 2014-06-06 (×3): via INTRAVENOUS

## 2014-06-06 MED ORDER — MAGNESIUM SULFATE BOLUS VIA INFUSION
4.0000 g | Freq: Once | INTRAVENOUS | Status: DC
  Filled 2014-06-06: qty 500

## 2014-06-06 MED ORDER — SODIUM CHLORIDE 0.9 % IJ SOLN: 3.0000 mL | INTRAMUSCULAR | Status: DC | PRN

## 2014-06-06 MED ORDER — OXYCODONE-ACETAMINOPHEN 5-325 MG PO TABS: 1.0000 | ORAL_TABLET | ORAL | Status: DC | PRN

## 2014-06-06 MED ORDER — SODIUM CHLORIDE 0.9 % IJ SOLN
3.0000 mL | Freq: Two times a day (BID) | INTRAMUSCULAR | Status: DC
  Administered 2014-06-07: 3 mL via INTRAVENOUS

## 2014-06-06 MED ORDER — ONDANSETRON HCL 4 MG/2ML IJ SOLN: 4.0000 mg | INTRAMUSCULAR | Status: DC | PRN

## 2014-06-06 MED ORDER — OXYCODONE-ACETAMINOPHEN 5-325 MG PO TABS
2.0000 | ORAL_TABLET | ORAL | Status: DC | PRN
Start: 2014-06-06 — End: 2014-06-07

## 2014-06-06 MED ORDER — ZOLPIDEM TARTRATE 5 MG PO TABS: 5.0000 mg | ORAL_TABLET | Freq: Every evening | ORAL | Status: DC | PRN

## 2014-06-06 MED ORDER — PRENATAL MULTIVITAMIN CH
1.0000 | ORAL_TABLET | Freq: Every day | ORAL | Status: DC
  Administered 2014-06-06: 1 via ORAL
  Filled 2014-06-06 (×2): qty 1

## 2014-06-06 MED ORDER — HYDRALAZINE HCL 20 MG/ML IJ SOLN: 5.0000 mg | INTRAMUSCULAR | Status: DC | PRN

## 2014-06-06 NOTE — ED Notes (Signed)
[facility] here to transport pt to [facility].

## 2014-06-06 NOTE — Progress Notes (Signed)
I received a referral at [place] to check on this pt after an emotional and physically exhausting weekend.  I spoke with her RN for today who reported that she is doing fine today.    I made two attempts to see pt.  In the morning, she was on the phone and asked if I could return and in the afternoon, she was asleep.    We will attempt follow-up tomorrow.  [place] Pager, [phone] 4:01 PM    06/06/14 1500  Clinical Encounter Type  Visited With Patient;Health care provider;Patient not available  Visit Type Initial

## 2014-06-06 NOTE — Progress Notes (Addendum)
Subjective:  Patient reports feeling better. She denies any headaches or visual changes nausea, vomiting, chest pain, shortness of breath, abdominal pain. Denies heavy vaginal bleeding.  Objective: I have reviewed patient's vital signs. Filed Vitals:   06/06/14 0800 06/06/14 0900 06/06/14 1000 06/06/14 1004  BP: 122/77 109/70  105/68  Pulse: 61 92  85  Temp: 98.4 F (36.9 C)     TempSrc: Oral     Resp: 18 16 16    Height:      Weight:      SpO2: 100% 99%  98%    General: alert, cooperative and no distress Resp: clear to auscultation bilaterally Cardio: regular rate and rhythm, S1, S2 normal, no murmur, click, rub or gallop GI: soft, non-tender; bowel sounds normal; no masses,  no organomegaly Extremities: extremities normal, atraumatic, no cyanosis or edema  2+ patellar reflex.  Abdomen: Incision C/D/I/    CBC    Component Value Date/Time   WBC 7.8 06/05/2014 1825   RBC 4.84 06/05/2014 1825   HGB 14.0 06/05/2014 1825   HCT 42.3 06/05/2014 1825   PLT 184 06/05/2014 1825   MCV 87.4 06/05/2014 1825   MCH 28.9 06/05/2014 1825   MCHC 33.1 06/05/2014 1825   RDW 14.4 06/05/2014 1825   LYMPHSABS 1.3 06/04/2014 1455   MONOABS 0.5 06/04/2014 1455   EOSABS 0.1 06/04/2014 1455   BASOSABS 0.0 06/04/2014 1455   CMP     Component Value Date/Time   NA 137 06/05/2014 1825   K 3.7 06/05/2014 1825   CL 103 06/05/2014 1825   CO2 27 06/05/2014 1825   GLUCOSE 82 06/05/2014 1825   BUN 9 06/05/2014 1825   CREATININE 0.78 06/05/2014 1825   CALCIUM 8.6 06/05/2014 1825   PROT 5.9* 06/05/2014 1825   ALBUMIN 3.0* 06/05/2014 1825   AST 143* 06/05/2014 1825   ALT 365* 06/05/2014 1825   ALKPHOS 115 06/05/2014 1825   BILITOT 0.6 06/05/2014 1825   GFRNONAA >90 06/05/2014 1825   GFRAA >90 06/05/2014 1825    Assessment/Plan:  Postpartum day 8 after vaginal delivery and BTL readmitted with postpartum preeclampsia on Magnesium sulfate -Continue magnesium until midnight. -Preeclampsia  labs tomorrow.   LOS: 1 day    [facility] 06/06/2014, 11:32 AM

## 2014-06-06 NOTE — H&P (Signed)
Dana [name] is an 34 y.o. G2P2002 at 7 days postpartum who presents, to [facility], with severe headache.  Patient seen on 06/04/2014 and evaluated for chest pain and bradycardia.  Patient states she was told that she had an enlarged heart, but was stabilized and discharged home.  Patient states that she awoke with a headache and checked her blood pressure noting that it was in the 170s/100s.  Patient then took tylenol and attempted to rest only for b/p to remain elevated at next check.  Patient states at this time she reported back to [facility] with the thought that elevated bp may be related to her enlarged heart.  Patient denies HA at current and reports she was seeing spots prior to initiation of MgSO4.  Patient also denies edema, SOB, numbness/tingling, and epigastric pain.  Patient received a total of 6gram bolus of MgSO4 prior to her arrival at [facility].   Pertinent Gynecological History: OB History: G2, P2002 who delivered on 05/29/2014 at 38.2wks.  Delivery was uncomplicated with mild 2nd degree laceration.  Patient also had PP BTL that was uncomplicated.      Past Medical History  Diagnosis Date  . Ventral hernia 09/04/2011  . Thyroid disease   . Diabetes mellitus without complication     gestational diabetes  . Gestational diabetes     Past Surgical History  Procedure Laterality Date  . Wisdom tooth extraction  2001  . Eye surgery  2006    lasik - both eyes  . Ventral hernia repair  09/13/2011    Procedure: HERNIA REPAIR VENTRAL ADULT;  Surgeon: Imogene Burn. Georgette Dover, MD;  Location: [place];  Service: General;  Laterality: N/A;  . Hernia repair    . Tubal ligation Bilateral 05/30/2014    Procedure: POST PARTUM TUBAL LIGATION;  Surgeon: Ena Dawley, MD;  Location: [facility];  Service: Gynecology;  Laterality: Bilateral;    Family History  Problem Relation Age of Onset  . Cancer Maternal Grandmother     lung  . Hypertension Maternal Grandmother   . Hypertension  Mother   . Heart disease Maternal Grandfather     Social History:  reports that she has never smoked. She has never used smokeless tobacco. She reports that she does not drink alcohol or use illicit drugs.  Allergies: No Known Allergies  Prescriptions prior to admission  Medication Sig Dispense Refill Last Dose  . acetaminophen (TYLENOL) 500 MG tablet Take 1,000 mg by mouth every 6 (six) hours as needed (pain).   06/05/2014 at 1430  . docusate sodium (COLACE) 100 MG capsule Take 100 mg by mouth daily as needed (constipation).    06/04/2014 at Unknown time  . ibuprofen (ADVIL,MOTRIN) 600 MG tablet Take 1 tablet (600 mg total) by mouth every 6 (six) hours as needed. (Patient taking differently: Take 600 mg by mouth every 6 (six) hours as needed (pain). ) 30 tablet 2 06/04/2014 at Unknown time  . oxyCODONE-acetaminophen (PERCOCET/ROXICET) 5-325 MG per tablet Take 1 tablet by mouth every 4 (four) hours as needed (for pain scale less than 7). 30 tablet 0 couple days ago  . Prenatal Vit-Fe Fumarate-FA (PRENATAL MULTIVITAMIN) TABS tablet Take 1 tablet by mouth daily.    06/05/2014 at Unknown time    Review of Systems  Constitutional: Negative.   HENT: Negative.   Eyes: Positive for photophobia.       Prior to MgSO4 infusion  Respiratory: Negative.   Cardiovascular: Negative.  None currently  Gastrointestinal: Negative.   Musculoskeletal: Negative.   Skin: Negative.   Neurological: Negative.   Psychiatric/Behavioral: Negative.     Blood pressure 108/64, pulse 72, temperature 98.4 F (36.9 C), temperature source Oral, resp. rate 20, height 5\' 5"  (1.651 m), weight 214 lb (97.07 kg), SpO2 99 %, not currently breastfeeding.   Physical Exam  Constitutional: She is oriented to person, place, and time. She appears well-developed and well-nourished.  HENT:  Head: Normocephalic and atraumatic.  Eyes: EOM are normal.  Neck: Normal range of motion.  Cardiovascular: Normal rate, regular rhythm  and normal heart sounds.   Respiratory: Effort normal and breath sounds normal.  GI: Soft. Bowel sounds are normal. There is no tenderness.  Genitourinary:  Deferred  Musculoskeletal: Normal range of motion. She exhibits no edema.  Neurological: She is alert and oriented to person, place, and time.  Reflex Scores:      Bicep reflexes are 2+ on the right side and 3+ on the left side.      Patellar reflexes are 3+ on the right side and 2+ on the left side. Skin: Skin is warm and dry.  Psychiatric: She has a normal mood and affect.    Results for orders placed or performed during the hospital encounter of 06/05/14 (from the past 24 hour(s))  Urinalysis, Routine w reflex microscopic     Status: Abnormal   Collection Time: 06/05/14  6:09 PM  Result Value Ref Range   Color, Urine YELLOW YELLOW   APPearance CLEAR CLEAR   Specific Gravity, Urine 1.010 1.005 - 1.030   pH 7.5 5.0 - 8.0   Glucose, UA NEGATIVE NEGATIVE mg/dL   Hgb urine dipstick LARGE (A) NEGATIVE   Bilirubin Urine NEGATIVE NEGATIVE   Ketones, ur 15 (A) NEGATIVE mg/dL   Protein, ur 30 (A) NEGATIVE mg/dL   Urobilinogen, UA 1.0 0.0 - 1.0 mg/dL   Nitrite NEGATIVE NEGATIVE   Leukocytes, UA TRACE (A) NEGATIVE  Urine microscopic-add on     Status: Abnormal   Collection Time: 06/05/14  6:09 PM  Result Value Ref Range   Squamous Epithelial / LPF FEW (A) RARE   WBC, UA 3-6 <3 WBC/hpf   RBC / HPF 7-10 <3 RBC/hpf   Bacteria, UA FEW (A) RARE  CBC     Status: None   Collection Time: 06/05/14  6:25 PM  Result Value Ref Range   WBC 7.8 4.0 - 10.5 K/uL   RBC 4.84 3.87 - 5.11 MIL/uL   Hemoglobin 14.0 12.0 - 15.0 g/dL   HCT 42.3 36.0 - 46.0 %   MCV 87.4 78.0 - 100.0 fL   MCH 28.9 26.0 - 34.0 pg   MCHC 33.1 30.0 - 36.0 g/dL   RDW 14.4 11.5 - 15.5 %   Platelets 184 150 - 400 K/uL  Comprehensive metabolic panel     Status: Abnormal   Collection Time: 06/05/14  6:25 PM  Result Value Ref Range   Sodium 137 135 - 145 mmol/L    Potassium 3.7 3.5 - 5.1 mmol/L   Chloride 103 96 - 112 mmol/L   CO2 27 19 - 32 mmol/L   Glucose, Bld 82 70 - 99 mg/dL   BUN 9 6 - 23 mg/dL   Creatinine, Ser 0.78 0.50 - 1.10 mg/dL   Calcium 8.6 8.4 - 10.5 mg/dL   Total Protein 5.9 (L) 6.0 - 8.3 g/dL   Albumin 3.0 (L) 3.5 - 5.2 g/dL   AST 143 (H) 0 -  37 U/L   ALT 365 (H) 0 - 35 U/L   Alkaline Phosphatase 115 39 - 117 U/L   Total Bilirubin 0.6 0.3 - 1.2 mg/dL   GFR calc non Af Amer >90 >90 mL/min   GFR calc Af Amer >90 >90 mL/min   Anion gap 7 5 - 15  Protein / creatinine ratio, urine     Status: Abnormal   Collection Time: 06/05/14  9:47 PM  Result Value Ref Range   Creatinine, Urine 37.31 mg/dL   Total Protein, Urine 45 mg/dL   Protein Creatinine Ratio 1.21 (H) 0.00 - 0.15    Dg Chest 2 View  06/04/2014   CLINICAL DATA:  Difficulty breathing and bradycardia. Mid sternal chest pressure for 1 day. Patient is 6 days postpartum  EXAM: CHEST  2 VIEW  COMPARISON:  Chest CT June 04, 2014  FINDINGS: The heart is mildly enlarged with pulmonary vascularity within normal limits. There is a small left effusion. There is no frank edema or consolidation. No adenopathy. No bone lesions.  IMPRESSION: Mild cardiomegaly with small left effusion. No edema or consolidation.  These findings potentially may indicate that this patient is at risk for post partum cardiomyopathy. Appropriate evaluation in this regard advised.   Electronically Signed   By: Lowella Grip M.D.   On: 06/04/2014 19:04   Ct Head Wo Contrast  06/05/2014   CLINICAL DATA:  Acute onset severe headache  EXAM: CT HEAD WITHOUT CONTRAST  TECHNIQUE: Contiguous axial images were obtained from the base of the skull through the vertex without intravenous contrast.  COMPARISON:  None.  FINDINGS: The ventricles are normal in size and configuration. There is no mass, hemorrhage, extra-axial fluid collection, or midline shift. Gray-white compartments are normal. No acute infarct apparent. The  bony calvarium appears intact. The visualized mastoid air cells are clear.  IMPRESSION: Study within normal limits.   Electronically Signed   By: Lowella Grip M.D.   On: 06/05/2014 19:26   Ct Angio Chest Pe W/cm &/or Wo Cm  06/04/2014   CLINICAL DATA:  Chest pain and shortness of breath for 2 days. The patient is 6 days postpartum.  EXAM: CT ANGIOGRAPHY CHEST WITH CONTRAST  TECHNIQUE: Multidetector CT imaging of the chest was performed using the standard protocol during bolus administration of intravenous contrast. Multiplanar CT image reconstructions and MIPs were obtained to evaluate the vascular anatomy.  CONTRAST:  75 mL OMNIPAQUE IOHEXOL 350 MG/ML SOLN  COMPARISON:  None.  FINDINGS: The study is somewhat limited due to bolus timing. No pulmonary embolus is identified. There are small bilateral pleural effusions. Heart size is mildly enlarged. No pericardial effusion is identified. No axillary, hilar or mediastinal lymphadenopathy. The lungs demonstrate mild dependent atelectasis.  Visualized upper abdomen shows no focal abnormality. No focal bony abnormality is identified.  Review of the MIP images confirms the above findings.  IMPRESSION: Negative for pulmonary embolus.  Small bilateral pleural effusions.  Mild cardiomegaly.   Electronically Signed   By: Inge Rise M.D.   On: 06/04/2014 18:47    Assessment: 34 y.o. Female 7wks PP PreEclampsia  Plan: Admit to AICU for observation per consult with Dr.T. Cole PreEclampsia Orders per [facility] Guidelines  Okay for Regular Diet IV Hydralazine  No Labetalol due to recent bradycardia Continue MgSO4 Infusion at 2gr/hr   Sedalia Muta, MSN, CNM 06/06/2014, 2:06 AM

## 2014-06-07 LAB — COMPREHENSIVE METABOLIC PANEL
ALT: 171 U/L — ABNORMAL HIGH (ref 0–35)
AST: 32 U/L (ref 0–37)
Albumin: 2.9 g/dL — ABNORMAL LOW (ref 3.5–5.2)
Alkaline Phosphatase: 106 U/L (ref 39–117)
Anion gap: 6 (ref 5–15)
BUN: 12 mg/dL (ref 6–23)
CALCIUM: 7.3 mg/dL — AB (ref 8.4–10.5)
CO2: 25 mmol/L (ref 19–32)
Chloride: 112 mmol/L (ref 96–112)
Creatinine, Ser: 0.67 mg/dL (ref 0.50–1.10)
GFR calc Af Amer: >90 mL/min (ref >90)
GFR calc non Af Amer: >90 mL/min (ref >90)
Glucose, Bld: 101 mg/dL — ABNORMAL HIGH (ref 70–99)
POTASSIUM: 3.8 mmol/L (ref 3.5–5.1)
SODIUM: 143 mmol/L (ref 135–145)
TOTAL PROTEIN: 5.7 g/dL — AB (ref 6.0–8.3)
Total Bilirubin: 0.6 mg/dL (ref 0.3–1.2)

## 2014-06-07 LAB — CBC
HEMATOCRIT: 41.4 % (ref 36.0–46.0)
Hemoglobin: 13.6 g/dL (ref 12.0–15.0)
MCH: 28.7 pg (ref 26.0–34.0)
MCHC: 32.9 g/dL (ref 30.0–36.0)
MCV: 87.3 fL (ref 78.0–100.0)
Platelets: 177 10*3/uL (ref 150–400)
RBC: 4.74 MIL/uL (ref 3.87–5.11)
RDW: 14.7 % (ref 11.5–15.5)
WBC: 10.5 10*3/uL (ref 4.0–10.5)

## 2014-06-07 LAB — LACTATE DEHYDROGENASE: LDH: 290 U/L — ABNORMAL HIGH (ref 94–250)

## 2014-06-07 LAB — URIC ACID: URIC ACID, SERUM: 6.1 mg/dL (ref 2.4–7.0)

## 2014-06-07 NOTE — Discharge Instructions (Signed)
Preeclampsia and Eclampsia °Preeclampsia is a serious condition that develops only during pregnancy. It is also called toxemia of pregnancy. This condition causes high blood pressure along with other symptoms, such as swelling and headaches. These may develop as the condition gets worse. Preeclampsia may occur 20 weeks or later into your pregnancy.  °Diagnosing and treating preeclampsia early is very important. If not treated early, it can cause serious problems for you and your baby. One problem it can lead to is eclampsia, which is a condition that causes muscle jerking or shaking (convulsions) in the mother. Delivering your baby is the best treatment for preeclampsia or eclampsia.  °RISK FACTORS °The cause of preeclampsia is not known. You may be more likely to develop preeclampsia if you have certain risk factors. These include:  °· Being pregnant for the first time. °· Having preeclampsia in a past pregnancy. °· Having a family history of preeclampsia. °· Having high blood pressure. °· Being pregnant with twins or triplets. °· Being 35 or older. °· Being African American. °· Having kidney disease or diabetes. °· Having medical conditions such as lupus or blood diseases. °· Being very overweight (obese). °SIGNS AND SYMPTOMS  °The earliest signs of preeclampsia are: °· High blood pressure. °· Increased protein in your urine. Your health care provider will check for this at every prenatal visit. °Other symptoms that can develop include:  °· Severe headaches. °· Sudden weight gain. °· Swelling of your hands, face, legs, and feet. °· Feeling sick to your stomach (nauseous) and throwing up (vomiting). °· Vision problems (blurred or double vision). °· Numbness in your face, arms, legs, and feet. °· Dizziness. °· Slurred speech. °· Sensitivity to bright lights. °· Abdominal pain. °DIAGNOSIS  °There are no screening tests for preeclampsia. Your health care provider will ask you about symptoms and check for signs of  preeclampsia during your prenatal visits. You may also have tests, including: °· Urine testing. °· Blood testing. °· Checking your baby's heart rate. °· Checking the health of your baby and your placenta using images created with sound waves (ultrasound). °TREATMENT  °You can work out the best treatment approach together with your health care provider. It is very important to keep all prenatal appointments. If you have an increased risk of preeclampsia, you may need more frequent prenatal exams. °· Your health care provider may prescribe bed rest. °· You may have to eat as little salt as possible. °· You may need to take medicine to lower your blood pressure if the condition does not respond to more conservative measures. °· You may need to stay in the hospital if your condition is severe. There, treatment will focus on controlling your blood pressure and fluid retention. You may also need to take medicine to prevent seizures. °· If the condition gets worse, your baby may need to be delivered early to protect you and the baby. You may have your labor started with medicine (be induced), or you may have a cesarean delivery. °· Preeclampsia usually goes away after the baby is born. °HOME CARE INSTRUCTIONS  °· Only take over-the-counter or prescription medicines as directed by your health care provider. °· Lie on your left side while resting. This keeps pressure off your baby. °· Elevate your feet while resting. °· Get regular exercise. Ask your health care provider what type of exercise is safe for you. °· Avoid caffeine and alcohol. °· Do not smoke. °· Drink 6-8 glasses of water every day. °· Eat a balanced diet   that is low in salt. Do not add salt to your food.  Avoid stressful situations as much as possible.  Get plenty of rest and sleep.  Keep all prenatal appointments and tests as scheduled. SEEK MEDICAL CARE IF:  You are gaining more weight than expected.  You have any headaches, abdominal pain, or  nausea.  You are bruising more than usual.  You feel dizzy or light-headed. SEEK IMMEDIATE MEDICAL CARE IF:   You develop sudden or severe swelling anywhere in your body. This usually happens in the legs.  You gain 5 lb (2.3 kg) or more in a week.  You have a severe headache, dizziness, problems with your vision, or confusion.  You have severe abdominal pain.  You have lasting nausea or vomiting.  You have a seizure.  You have trouble moving any part of your body.  You develop numbness in your body.  You have trouble speaking.  You have any abnormal bleeding.  You develop a stiff neck.  You pass out. MAKE SURE YOU:   Understand these instructions.  Will watch your condition.  Will get help right away if you are not doing well or get worse. Document Released: 04/19/2000 Document Revised: 04/27/2013 Document Reviewed: 02/12/2013 [facility] Patient Information 2015 [place], [place]. This information is not intended to replace advice given to you by your health care provider. Make sure you discuss any questions you have with your health care provider.  Cardiomyopathy Cardiomyopathy means a disease of the heart muscle. The heart muscle becomes enlarged or stiff. The heart is not able to pump enough blood or deliver enough oxygen to the body. This leads to heart failure and is the number one reason for heart transplants.  TYPES OF CARDIOMYOPATHY INCLUDE: DILATED  The most common type. The heart muscle is stretched out and weak so there is less blood pumped out.   Some causes:  Disease of the arteries of the heart (ischemia).  Heart attack with muscle scar.  Leaky or damaged valves.  After a viral illness.  Smoking.  High cholesterol.  Diabetes or overactive thyroid.  Alcohol or drug abuse.  High blood pressure.  May be reversible. HYPERTROPHIC The heart muscle grows bigger so there is less room for blood in the ventricle, and not enough blood is pumped out.     Causes include:  Mitral valve leaks.  Inherited tendency (from your family).  No explanation (idiopathic).  May be a cause of sudden death in young athletes with no symptoms. RESTRICTIVE The heart muscle becomes stiff, but not always larger. The heart has to work harder and will get weaker. Abnormal heart beats or rhythm (arrhythmia) are common.  Some causes:  Diseases in other parts of the body which may produce abnormal deposits in the heart muscle.  Probably not inherited.  A result of radiation treatment for cancer. SYMPTOMS OF ALL TYPES:  Less able to exercise or tolerate physical activity.  Palpitations.  Irregular heart beat, heart arrhythmias.  Shortness of breath, even at rest.  Chest pain.  Lightheadedness or fainting. TREATMENT  Life-style changes including reducing salt, lowering cholesterol, stop smoking.  Manage contributing causes with medications.  Medicines to help reduce the fluids in the body.  An implanted cardioverter defibrillator (ICD) to improve heart function and correct arrhythmias.  Medications to relax the blood vessels and make it easier for the heart to pump.  Drugs that help regulate heart beat and improve heart relaxation, reducing the work of the heart.  Myomectomy for  patients with hypertrophic cardiomyopathy and severe problems. This is a surgical procedure that removes a portion of the thickened muscle wall in order to improve heart output and provide symptom relief.  A heart transplant is an option in carefully applied circumstances. SEEK IMMEDIATE MEDICAL CARE IF:   You have severe chest pain, especially if the pain is crushing or pressure-like and spreads to the arms, back, neck, or jaw, or if you have sweating, feeling sick to your stomach (nausea), or shortness of breath. THIS IS AN EMERGENCY. Do not wait to see if the pain will go away. Get medical help at once. Call your local emergency services (911 in [place] DO NOT  drive yourself to the hospital.  You develop severe shortness of breath.  You begin to cough up bloody sputum.  You are unable to sleep because you cannot breathe.  You gain weight due to fluid retention.  You develop painful swelling in your calf or leg.  You feel your heart racing and it does not go away or happens when you are resting. Document Released: 07/05/2004 Document Revised: 07/15/2011 Document Reviewed: 12/09/2007 [facility] Patient Information 2015 [place], [place]. This information is not intended to replace advice given to you by your health care provider. Make sure you discuss any questions you have with your health care provider.

## 2014-06-07 NOTE — Progress Notes (Signed)
UR chart review completed.  

## 2014-06-07 NOTE — Discharge Summary (Signed)
Physician Discharge Summary  Patient ID: Dana [name] MRN: [id number] DOB/AGE: November 27, 1980 34 y.o.  Admit date: 06/05/2014 Discharge date: 06/07/2014  Admission Diagnoses:  Discharge Diagnoses:  Active Problems:   Preeclampsia in postpartum period   Cardiomyopathy, peripartum, postpartum   Discharged Condition: stable  Hospital Course: Admitted 06/05/14 after presenting to [facility] with HA at 7 days s/p uncomplicated vaginal delivery--had been seen there 06/04/14 for chest pain and bradycardia, with dx of mild cardiomegaly, mild pleural effusion.  D/C'd home to f/u with cardiology as an outpatient.  Patient then had onset of severe HA, associated with hypertension, with diagnosis of pre-eclampsia.  LFTs were significantly elevated (AST 143, ALT 365).  Patient was transported to [facility] from [facility] with magnesium initiated--maintained on mag x 24 hours.   BP normalized, LFTs decreased (AST 32, ALT 171), and patient was d/c'd home on 06/07/14 for outpatient cardiac consult and [facility] RN visit on 2/4 for BP f/u.  Patient was asymptomatic, no edema, denied HA, visual sx, or epigastric pain.  Consults: None  Significant Diagnostic Studies: labs: PI[facility] labs as above  Treatments: Magnesium sulfate x 24 hours  Discharge Exam: Blood pressure 126/85, pulse 61, temperature 98.5 F (36.9 C), temperature source Oral, resp. rate 16, height 5\' 5"  (1.651 m), weight 205 lb 9.6 oz (93.26 kg), SpO2 99 %, not currently breastfeeding. General appearance: alert Resp: clear to auscultation bilaterally Breasts: normal appearance, no masses or tenderness Cardio: regular rate and rhythm, S1, S2 normal, no murmur, click, rub or gallop Pelvic: external genitalia normal, rectovaginal septum normal, uterus normal size, shape, and consistency and vagina normal without discharge Extremities: extremities normal, atraumatic, no cyanosis or edema and Homans sign is negative, no sign of DVT, DTR 1+, no clonus.  Disposition:  01-Home or Self Care     Medication List    TAKE these medications        acetaminophen 500 MG tablet  Commonly known as:  TYLENOL  Take 1,000 mg by mouth every 6 (six) hours as needed (pain).     docusate sodium 100 MG capsule  Commonly known as:  COLACE  Take 100 mg by mouth daily as needed (constipation).     ibuprofen 600 MG tablet  Commonly known as:  ADVIL,MOTRIN  Take 1 tablet (600 mg total) by mouth every 6 (six) hours as needed.     oxyCODONE-acetaminophen 5-325 MG per tablet  Commonly known as:  PERCOCET/ROXICET  Take 1 tablet by mouth every 4 (four) hours as needed (for pain scale less than 7).     prenatal multivitamin Tabs tablet  Take 1 tablet by mouth daily.           Follow-up Information    Follow up with [facility] In 4 weeks.   Specialty:  Obstetrics and Gynecology   Why:  Call for any questions or concerns.  [facility] RN to see you this week for f/u.   Contact information:   [address] [place] [address] [phone]      Please follow up.   Why:  Patient to call cardiologist office to schedule outpatient f/u ASAP.    (Dr. Claiborne Billings)  Signed: Donnel Saxon 06/07/2014, 11:31 AM

## 2014-06-07 NOTE — Progress Notes (Signed)
Called [facility] - spoke with Clarene Critchley to initiate preeclampsia f/u on Thurs 2/4 & call [facility].

## 2014-06-08 ENCOUNTER — Inpatient Hospital Stay (HOSPITAL_COMMUNITY): Payer: BLUE CROSS/BLUE SHIELD

## 2014-06-08 ENCOUNTER — Encounter (HOSPITAL_COMMUNITY): Payer: Self-pay | Admitting: *Deleted

## 2014-06-08 ENCOUNTER — Inpatient Hospital Stay (HOSPITAL_COMMUNITY)
Admission: AD | Admit: 2014-06-08 | Discharge: 2014-06-11 | DRG: 776 | Disposition: A | Discharge status: Home / Self Care | Payer: BLUE CROSS/BLUE SHIELD | Source: Ambulatory Visit | Attending: Obstetrics & Gynecology | Admitting: Obstetrics & Gynecology

## 2014-06-08 DIAGNOSIS — O9089 Other complications of the puerperium, not elsewhere classified: Secondary | ICD-10-CM | POA: Diagnosis present

## 2014-06-08 DIAGNOSIS — R51 Headache: Secondary | ICD-10-CM | POA: Diagnosis present

## 2014-06-08 DIAGNOSIS — O1495 Unspecified pre-eclampsia, complicating the puerperium: Secondary | ICD-10-CM | POA: Diagnosis present

## 2014-06-08 DIAGNOSIS — I158 Other secondary hypertension: Secondary | ICD-10-CM | POA: Diagnosis present

## 2014-06-08 DIAGNOSIS — O903 Peripartum cardiomyopathy: Secondary | ICD-10-CM | POA: Diagnosis present

## 2014-06-08 DIAGNOSIS — O9943 Diseases of the circulatory system complicating the puerperium: Secondary | ICD-10-CM | POA: Diagnosis present

## 2014-06-08 DIAGNOSIS — R0989 Other specified symptoms and signs involving the circulatory and respiratory systems: Secondary | ICD-10-CM | POA: Diagnosis present

## 2014-06-08 DIAGNOSIS — O1490 Unspecified pre-eclampsia, unspecified trimester: Secondary | ICD-10-CM | POA: Insufficient documentation

## 2014-06-08 DIAGNOSIS — Z8249 Family history of ischemic heart disease and other diseases of the circulatory system: Secondary | ICD-10-CM

## 2014-06-08 DIAGNOSIS — R079 Chest pain, unspecified: Secondary | ICD-10-CM

## 2014-06-08 DIAGNOSIS — I340 Nonrheumatic mitral (valve) insufficiency: Secondary | ICD-10-CM | POA: Diagnosis present

## 2014-06-08 DIAGNOSIS — R001 Bradycardia, unspecified: Secondary | ICD-10-CM | POA: Diagnosis present

## 2014-06-08 HISTORY — DX: Other specified symptoms and signs involving the circulatory and respiratory systems: R09.89

## 2014-06-08 LAB — COMPREHENSIVE METABOLIC PANEL
ALT: 146 U/L — ABNORMAL HIGH (ref 0–35)
ANION GAP: 9 (ref 5–15)
AST: 40 U/L — ABNORMAL HIGH (ref 0–37)
Albumin: 3.7 g/dL (ref 3.5–5.2)
Alkaline Phosphatase: 135 U/L — ABNORMAL HIGH (ref 39–117)
BILIRUBIN TOTAL: 1.2 mg/dL (ref 0.3–1.2)
BUN: 10 mg/dL (ref 6–23)
CALCIUM: 8.9 mg/dL (ref 8.4–10.5)
CHLORIDE: 106 mmol/L (ref 96–112)
CO2: 24 mmol/L (ref 19–32)
Creatinine, Ser: 0.68 mg/dL (ref 0.50–1.10)
Glucose, Bld: 95 mg/dL (ref 70–99)
Potassium: 4.1 mmol/L (ref 3.5–5.1)
SODIUM: 139 mmol/L (ref 135–145)
Total Protein: 7.6 g/dL (ref 6.0–8.3)

## 2014-06-08 LAB — CBC WITH DIFFERENTIAL/PLATELET
Basophils Absolute: 0 10*3/uL (ref 0.0–0.1)
Basophils Relative: 0 % (ref 0–1)
Eosinophils Absolute: 0.1 10*3/uL (ref 0.0–0.7)
Eosinophils Relative: 0 % (ref 0–5)
HCT: 44.1 % (ref 36.0–46.0)
HEMOGLOBIN: 14.8 g/dL (ref 12.0–15.0)
LYMPHS ABS: 1.9 10*3/uL (ref 0.7–4.0)
LYMPHS PCT: 16 % (ref 12–46)
MCH: 29.4 pg (ref 26.0–34.0)
MCHC: 33.6 g/dL (ref 30.0–36.0)
MCV: 87.7 fL (ref 78.0–100.0)
Monocytes Absolute: 1 10*3/uL (ref 0.1–1.0)
Monocytes Relative: 9 % (ref 3–12)
NEUTROS PCT: 75 % (ref 43–77)
Neutro Abs: 8.7 10*3/uL — ABNORMAL HIGH (ref 1.7–7.7)
PLATELETS: 180 10*3/uL (ref 150–400)
RBC: 5.03 MIL/uL (ref 3.87–5.11)
RDW: 14.7 % (ref 11.5–15.5)
WBC: 11.6 10*3/uL — ABNORMAL HIGH (ref 4.0–10.5)

## 2014-06-08 LAB — PROTEIN / CREATININE RATIO, URINE
Creatinine, Urine: 26 mg/dL
PROTEIN CREATININE RATIO: 0.92 — AB (ref 0.00–0.15)
Total Protein, Urine: 24 mg/dL

## 2014-06-08 LAB — URINALYSIS, ROUTINE W REFLEX MICROSCOPIC
BILIRUBIN URINE: NEGATIVE
GLUCOSE, UA: NEGATIVE mg/dL
KETONES UR: NEGATIVE mg/dL
NITRITE: NEGATIVE
Protein, ur: NEGATIVE mg/dL
Specific Gravity, Urine: 1.015 (ref 1.005–1.030)
UROBILINOGEN UA: 0.2 mg/dL (ref 0.0–1.0)
pH: 6 (ref 5.0–8.0)

## 2014-06-08 LAB — LACTATE DEHYDROGENASE: LDH: 422 U/L — ABNORMAL HIGH (ref 94–250)

## 2014-06-08 LAB — TSH: TSH: 2.177 u[IU]/mL (ref 0.350–4.500)

## 2014-06-08 LAB — URINE MICROSCOPIC-ADD ON

## 2014-06-08 LAB — URIC ACID: URIC ACID, SERUM: 5.1 mg/dL (ref 2.4–7.0)

## 2014-06-08 MED ORDER — DIPHENHYDRAMINE HCL 50 MG/ML IJ SOLN
25.0000 mg | Freq: Once | INTRAMUSCULAR | Status: AC
  Administered 2014-06-08: 25 mg via INTRAVENOUS
  Filled 2014-06-08: qty 1

## 2014-06-08 MED ORDER — ONDANSETRON HCL 4 MG/2ML IJ SOLN: 4.0000 mg | INTRAMUSCULAR | Status: DC | PRN

## 2014-06-08 MED ORDER — DIPHENHYDRAMINE HCL 25 MG PO CAPS: 25.0000 mg | ORAL_CAPSULE | Freq: Four times a day (QID) | ORAL | Status: DC | PRN

## 2014-06-08 MED ORDER — SENNOSIDES-DOCUSATE SODIUM 8.6-50 MG PO TABS: 2.0000 | ORAL_TABLET | ORAL | Status: DC

## 2014-06-08 MED ORDER — DIBUCAINE 1 % RE OINT: 1.0000 "application " | TOPICAL_OINTMENT | RECTAL | Status: DC | PRN

## 2014-06-08 MED ORDER — OXYCODONE-ACETAMINOPHEN 5-325 MG PO TABS
1.0000 | ORAL_TABLET | ORAL | Status: DC | PRN
  Administered 2014-06-09 – 2014-06-11 (×2): 1 via ORAL
  Filled 2014-06-08 (×2): qty 1

## 2014-06-08 MED ORDER — WITCH HAZEL-GLYCERIN EX PADS: 1.0000 "application " | MEDICATED_PAD | CUTANEOUS | Status: DC | PRN

## 2014-06-08 MED ORDER — LANOLIN HYDROUS EX OINT: TOPICAL_OINTMENT | CUTANEOUS | Status: DC | PRN

## 2014-06-08 MED ORDER — ZOLPIDEM TARTRATE 5 MG PO TABS: 5.0000 mg | ORAL_TABLET | Freq: Every evening | ORAL | Status: DC | PRN

## 2014-06-08 MED ORDER — MAGNESIUM SULFATE BOLUS VIA INFUSION
4.0000 g | Freq: Once | INTRAVENOUS | Status: AC
  Administered 2014-06-08: 4 g via INTRAVENOUS
  Filled 2014-06-08: qty 500

## 2014-06-08 MED ORDER — TETANUS-DIPHTH-ACELL PERTUSSIS 5-2.5-18.5 LF-MCG/0.5 IM SUSP
0.5000 mL | Freq: Once | INTRAMUSCULAR | Status: DC
  Filled 2014-06-08: qty 0.5

## 2014-06-08 MED ORDER — IBUPROFEN 600 MG PO TABS
600.0000 mg | ORAL_TABLET | Freq: Four times a day (QID) | ORAL | Status: DC
  Administered 2014-06-09 – 2014-06-10 (×6): 600 mg via ORAL
  Filled 2014-06-08 (×6): qty 1

## 2014-06-08 MED ORDER — BENZOCAINE-MENTHOL 20-0.5 % EX AERO: 1.0000 "application " | INHALATION_SPRAY | CUTANEOUS | Status: DC | PRN

## 2014-06-08 MED ORDER — PRENATAL MULTIVITAMIN CH
1.0000 | ORAL_TABLET | Freq: Every day | ORAL | Status: DC
  Administered 2014-06-09 – 2014-06-10 (×2): 1 via ORAL
  Filled 2014-06-08 (×2): qty 1

## 2014-06-08 MED ORDER — LABETALOL HCL 5 MG/ML IV SOLN: 10.0000 mg | INTRAVENOUS | Status: DC | PRN

## 2014-06-08 MED ORDER — METOCLOPRAMIDE HCL 5 MG/ML IJ SOLN
10.0000 mg | Freq: Once | INTRAMUSCULAR | Status: AC
  Administered 2014-06-08: 10 mg via INTRAVENOUS
  Filled 2014-06-08: qty 2

## 2014-06-08 MED ORDER — ONDANSETRON HCL 4 MG PO TABS: 4.0000 mg | ORAL_TABLET | ORAL | Status: DC | PRN

## 2014-06-08 MED ORDER — OXYCODONE-ACETAMINOPHEN 5-325 MG PO TABS
2.0000 | ORAL_TABLET | ORAL | Status: DC | PRN
  Administered 2014-06-10: 2 via ORAL
  Filled 2014-06-08: qty 2

## 2014-06-08 MED ORDER — SIMETHICONE 80 MG PO CHEW: 80.0000 mg | CHEWABLE_TABLET | ORAL | Status: DC | PRN

## 2014-06-08 MED ORDER — MAGNESIUM SULFATE 40 G IN LACTATED RINGERS - SIMPLE
2.0000 g/h | INTRAVENOUS | Status: AC
  Administered 2014-06-08 – 2014-06-09 (×2): 2 g/h via INTRAVENOUS
  Filled 2014-06-08 (×2): qty 500

## 2014-06-08 MED ORDER — DEXAMETHASONE SODIUM PHOSPHATE 10 MG/ML IJ SOLN
10.0000 mg | Freq: Once | INTRAMUSCULAR | Status: AC
  Administered 2014-06-08: 10 mg via INTRAVENOUS
  Filled 2014-06-08: qty 1

## 2014-06-08 MED ORDER — HYDRALAZINE HCL 20 MG/ML IJ SOLN
10.0000 mg | INTRAMUSCULAR | Status: DC | PRN
  Administered 2014-06-10: 10 mg via INTRAVENOUS
  Filled 2014-06-08: qty 1

## 2014-06-08 MED ORDER — SODIUM CHLORIDE 0.9 % IV SOLN
INTRAVENOUS | Status: AC
  Administered 2014-06-08 – 2014-06-09 (×3): via INTRAVENOUS

## 2014-06-08 MED ORDER — HYDRALAZINE HCL 20 MG/ML IJ SOLN
20.0000 mg | Freq: Once | INTRAMUSCULAR | Status: AC
  Administered 2014-06-08: 20 mg via INTRAVENOUS
  Filled 2014-06-08: qty 1

## 2014-06-08 NOTE — MAU Note (Signed)
Pt back from radiology and will go to AICU from MAU as per Brianne RN house coverage.

## 2014-06-08 NOTE — MAU Provider Note (Signed)
Dana [name] is a 34 y.o. present to MAU c/o severe HA since 1700 today.  SP Vaginal delivery on 05/29/14.  She was admitted to [facility] on 06/04/14 for heart palpation and bradycardia.  She was readmitted to [facility] for PP pre X on 06/06/13 and discharged yesterday.     History     Patient Active Problem List   Diagnosis Date Noted  . Cardiomyopathy, peripartum, postpartum 06/07/2014  . Preeclampsia in postpartum period 06/06/2014  . Second-degree perineal laceration, with delivery 05/29/2014  . Velamentous insertion of umbilical cord 50/93/2671  . SVD (spontaneous vaginal delivery) 05/29/2014  . Fibroids, subserous - measuring 2x2 cm in left anterior corpus 11/01/2013  . Subchorionic hemorrhage in first trimester 11/01/2013  . Ventral hernia 09/04/2011    Chief Complaint  Patient presents with  . Headache   HPI  OB History    Gravida Para Term Preterm AB TAB SAB Ectopic Multiple Living   2 2 2       0 1      Past Medical History  Diagnosis Date  . Ventral hernia 09/04/2011  . Thyroid disease   . Diabetes mellitus without complication     gestational diabetes  . Gestational diabetes     Past Surgical History  Procedure Laterality Date  . Wisdom tooth extraction  2001  . Eye surgery  2006    lasik - both eyes  . Ventral hernia repair  09/13/2011    Procedure: HERNIA REPAIR VENTRAL ADULT;  Surgeon: Imogene Burn. Georgette Dover, MD;  Location: [place];  Service: General;  Laterality: N/A;  . Hernia repair    . Tubal ligation Bilateral 05/30/2014    Procedure: POST PARTUM TUBAL LIGATION;  Surgeon: Ena Dawley, MD;  Location: [facility];  Service: Gynecology;  Laterality: Bilateral;    Family History  Problem Relation Age of Onset  . Cancer Maternal Grandmother     lung  . Hypertension Maternal Grandmother   . Hypertension Mother   . Heart disease Maternal Grandfather     History  Substance Use Topics  . Smoking status: Never Smoker   . Smokeless tobacco: Never  Used  . Alcohol Use: No    Allergies: No Known Allergies  Prescriptions prior to admission  Medication Sig Dispense Refill Last Dose  . acetaminophen (TYLENOL) 500 MG tablet Take 1,000 mg by mouth every 6 (six) hours as needed (pain).   06/08/2014 at 1700  . ibuprofen (ADVIL,MOTRIN) 600 MG tablet Take 1 tablet (600 mg total) by mouth every 6 (six) hours as needed. (Patient taking differently: Take 600 mg by mouth every 6 (six) hours as needed (pain). ) 30 tablet 2 06/08/2014 at Unknown time  . oxyCODONE-acetaminophen (PERCOCET/ROXICET) 5-325 MG per tablet Take 1 tablet by mouth every 4 (four) hours as needed (for pain scale less than 7). 30 tablet 0 Past Week at Unknown time  . Prenatal Vit-Fe Fumarate-FA (PRENATAL MULTIVITAMIN) TABS tablet Take 1 tablet by mouth daily.    06/05/2014 at Unknown time    ROS See HPI above, all other systems are negative  Physical Exam   Blood pressure 188/95, pulse 57, temperature 98.2 F (36.8 C), temperature source Oral, resp. rate 20, not currently breastfeeding.  Physical Exam Ext:  WNL ABD: Soft, non tender to palpation, no rebound or guarding   ED Course  Assessment: HA of unknown orgin   Plan: PIH labs HA protocol Consult with Dr Zada Finders Conrad Zajkowski, CNM, MSN 06/08/2014. 8:14 PM

## 2014-06-08 NOTE — MAU Note (Signed)
Lab in to draw blood.

## 2014-06-08 NOTE — MAU Note (Signed)
Magnesium Sulfate drip initiated with 4 gram loading dose.

## 2014-06-08 NOTE — H&P (Signed)
Dana [name] is an 34 y.o. G2P2002 at 11 days postpartum who presents, to to MAU with severe HA rating the pain 8/10.  She state she feels" like her head is about to explode".  She was admitted to [facility], on 06/04/14 for chest pain and bradycardia.  [facility] reports cardiomegalia but she was stabilized and discharged home. On 06/06/14 she was reamitted to [facility] for a severe HA and put on mag x 24 hours.  Today she reports her HA started about 1700.  She reports chest discomfort when lying flat.  Denies all other pain. She has a hx of thyroid disease but hasn't taken meds in 5 years.  No current htn medication.  Pertinent Gynecological History: OB History: G2, P2002 who delivered on 05/29/2014 at 38.2wks. Delivery was uncomplicated with mild 2nd degree laceration. Patient also had PP BTL that was uncomplicated.she report her bleeding and "coming and going"    Past Medical History  Diagnosis Date  . Ventral hernia 09/04/2011  . Thyroid disease   . Diabetes mellitus without complication     gestational diabetes  . Gestational diabetes     Past Surgical History  Procedure Laterality Date  . Wisdom tooth extraction  2001  . Eye surgery  2006    lasik - both eyes  . Ventral hernia repair  09/13/2011    Procedure: HERNIA REPAIR VENTRAL ADULT; Surgeon: Imogene Burn. Georgette Dover, MD; Location: [place]; Service: General; Laterality: N/A;  . Hernia repair    . Tubal ligation Bilateral 05/30/2014    Procedure: POST PARTUM TUBAL LIGATION; Surgeon: Ena Dawley, MD; Location: [facility]; Service: Gynecology; Laterality: Bilateral;    Family History  Problem Relation Age of Onset  . Cancer Maternal Grandmother     lung  . Hypertension Maternal Grandmother   . Hypertension Mother   . Heart disease Maternal Grandfather     Social History:  reports that she has never smoked. She has never  used smokeless tobacco. She reports that she does not drink alcohol or use illicit drugs.  Allergies: No Known Allergies  Prescriptions prior to admission  Medication Sig Dispense Refill Last Dose  . acetaminophen (TYLENOL) 500 MG tablet Take 1,000 mg by mouth every 6 (six) hours as needed (pain).   06/05/2014 at 1430  . docusate sodium (COLACE) 100 MG capsule Take 100 mg by mouth daily as needed (constipation).    06/04/2014 at Unknown time  . ibuprofen (ADVIL,MOTRIN) 600 MG tablet Take 1 tablet (600 mg total) by mouth every 6 (six) hours as needed. (Patient taking differently: Take 600 mg by mouth every 6 (six) hours as needed (pain). ) 30 tablet 2 06/04/2014 at Unknown time  . oxyCODONE-acetaminophen (PERCOCET/ROXICET) 5-325 MG per tablet Take 1 tablet by mouth every 4 (four) hours as needed (for pain scale less than 7). 30 tablet 0 couple days ago  . Prenatal Vit-Fe Fumarate-FA (PRENATAL MULTIVITAMIN) TABS tablet Take 1 tablet by mouth daily.    06/05/2014 at Unknown time     Recent Results (from the past 2160 hour(s))  OB RESULT CONSOLE Group B Strep     Status: None   Collection Time: 05/19/14 12:00 AM  Result Value Ref Range   GBS Negative   CBC     Status: Abnormal   Collection Time: 05/29/14  6:30 AM  Result Value Ref Range   WBC 10.1 4.0 - 10.5 K/uL   RBC 4.09 3.87 - 5.11 MIL/uL   Hemoglobin 11.9 (L) 12.0 -  15.0 g/dL   HCT 35.9 (L) 36.0 - 46.0 %   MCV 87.8 78.0 - 100.0 fL   MCH 29.1 26.0 - 34.0 pg   MCHC 33.1 30.0 - 36.0 g/dL   RDW 14.1 11.5 - 15.5 %   Platelets 128 (L) 150 - 400 K/uL  RPR     Status: None   Collection Time: 05/29/14  6:30 AM  Result Value Ref Range   RPR Ser Ql Non Reactive Non Reactive    Comment: (NOTE) Performed At: [facility] [address] [place], [place] [id number] Lindon Romp MD [id number]   HIV antibody     Status: None   Collection Time: 05/29/14  6:30 AM  Result Value Ref Range   HIV  1/O/2 Abs-Index Value <1.00 <1.00    Comment: Index Value: Specimen reactivity relative to the negative cutoff.   HIV-1/HIV-2 Ab Non Reactive Non Reactive    Comment: (NOTE) Performed At: [facility] [place], [place] [id number] Lindon Romp MD [id number]   Type and screen     Status: None   Collection Time: 05/29/14  6:30 AM  Result Value Ref Range   ABO/RH(D) AB POS    Antibody Screen NEG    Sample Expiration 06/01/2014   ABO/Rh     Status: None   Collection Time: 05/29/14  6:30 AM  Result Value Ref Range   ABO/RH(D) AB POS   Glucose, capillary     Status: None   Collection Time: 05/29/14  6:52 AM  Result Value Ref Range   Glucose-Capillary 84 70 - 99 mg/dL  Glucose, capillary     Status: Abnormal   Collection Time: 05/29/14 10:17 AM  Result Value Ref Range   Glucose-Capillary 107 (H) 70 - 99 mg/dL  Glucose, capillary     Status: Abnormal   Collection Time: 05/29/14  2:33 PM  Result Value Ref Range   Glucose-Capillary 68 (L) 70 - 99 mg/dL  MRSA PCR Screening     Status: None   Collection Time: 05/30/14  6:44 AM  Result Value Ref Range   MRSA by PCR NEGATIVE NEGATIVE    Comment:        The GeneXpert MRSA Assay (FDA approved for NASAL specimens only), is one component of a comprehensive MRSA colonization surveillance program. It is not intended to diagnose MRSA infection nor to guide or monitor treatment for MRSA infections.   CBC     Status: Abnormal   Collection Time: 05/30/14  6:55 AM  Result Value Ref Range   WBC 8.3 4.0 - 10.5 K/uL   RBC 4.05 3.87 - 5.11 MIL/uL   Hemoglobin 11.6 (L) 12.0 - 15.0 g/dL   HCT 35.8 (L) 36.0 - 46.0 %   MCV 88.4 78.0 - 100.0 fL   MCH 28.6 26.0 - 34.0 pg   MCHC 32.4 30.0 - 36.0 g/dL   RDW 14.5 11.5 - 15.5 %   Platelets 123 (L) 150 - 400 K/uL  CBC with Differential/Platelet     Status: Abnormal   Collection Time: 06/04/14  2:55 PM  Result Value Ref Range   WBC 6.0 4.0 - 10.5 K/uL   RBC 4.08 3.87 -  5.11 MIL/uL   Hemoglobin 11.8 (L) 12.0 - 15.0 g/dL   HCT 36.2 36.0 - 46.0 %   MCV 88.7 78.0 - 100.0 fL   MCH 28.9 26.0 - 34.0 pg   MCHC 32.6 30.0 - 36.0 g/dL   RDW 14.6 11.5 - 15.5 %  Platelets 156 150 - 400 K/uL   Neutrophils Relative % 67 43 - 77 %   Neutro Abs 4.0 1.7 - 7.7 K/uL   Lymphocytes Relative 22 12 - 46 %   Lymphs Abs 1.3 0.7 - 4.0 K/uL   Monocytes Relative 9 3 - 12 %   Monocytes Absolute 0.5 0.1 - 1.0 K/uL   Eosinophils Relative 2 0 - 5 %   Eosinophils Absolute 0.1 0.0 - 0.7 K/uL   Basophils Relative 0 0 - 1 %   Basophils Absolute 0.0 0.0 - 0.1 K/uL  Comprehensive metabolic panel     Status: Abnormal   Collection Time: 06/04/14  2:55 PM  Result Value Ref Range   Sodium 139 135 - 145 mmol/L   Potassium 4.1 3.5 - 5.1 mmol/L   Chloride 108 96 - 112 mmol/L   CO2 27 19 - 32 mmol/L   Glucose, Bld 80 70 - 99 mg/dL   BUN 14 6 - 23 mg/dL   Creatinine, Ser 0.66 0.50 - 1.10 mg/dL   Calcium 8.6 8.4 - 10.5 mg/dL   Total Protein 5.8 (L) 6.0 - 8.3 g/dL   Albumin 2.9 (L) 3.5 - 5.2 g/dL   AST 136 (H) 0 - 37 U/L   ALT 255 (H) 0 - 35 U/L   Alkaline Phosphatase 92 39 - 117 U/L   Total Bilirubin 0.5 0.3 - 1.2 mg/dL   GFR calc non Af Amer >90 >90 mL/min   GFR calc Af Amer >90 >90 mL/min    Comment: (NOTE) The eGFR has been calculated using the CKD EPI equation. This calculation has not been validated in all clinical situations. eGFR's persistently <90 mL/min signify possible Chronic Kidney Disease.    Anion gap 4 (L) 5 - 15  Troponin I     Status: None   Collection Time: 06/04/14  5:25 PM  Result Value Ref Range   Troponin I <0.03 <0.031 ng/mL    Comment:        NO INDICATION OF MYOCARDIAL INJURY.   Brain natriuretic peptide     Status: Abnormal   Collection Time: 06/04/14  6:00 PM  Result Value Ref Range   B Natriuretic Peptide 324.2 (H) 0.0 - 100.0 pg/mL  Urinalysis, Routine w reflex microscopic     Status: Abnormal   Collection Time: 06/05/14  6:09 PM  Result  Value Ref Range   Color, Urine YELLOW YELLOW   APPearance CLEAR CLEAR   Specific Gravity, Urine 1.010 1.005 - 1.030   pH 7.5 5.0 - 8.0   Glucose, UA NEGATIVE NEGATIVE mg/dL   Hgb urine dipstick LARGE (A) NEGATIVE   Bilirubin Urine NEGATIVE NEGATIVE   Ketones, ur 15 (A) NEGATIVE mg/dL   Protein, ur 30 (A) NEGATIVE mg/dL   Urobilinogen, UA 1.0 0.0 - 1.0 mg/dL   Nitrite NEGATIVE NEGATIVE   Leukocytes, UA TRACE (A) NEGATIVE  Urine microscopic-add on     Status: Abnormal   Collection Time: 06/05/14  6:09 PM  Result Value Ref Range   Squamous Epithelial / LPF FEW (A) RARE   WBC, UA 3-6 <3 WBC/hpf   RBC / HPF 7-10 <3 RBC/hpf   Bacteria, UA FEW (A) RARE  CBC     Status: None   Collection Time: 06/05/14  6:25 PM  Result Value Ref Range   WBC 7.8 4.0 - 10.5 K/uL   RBC 4.84 3.87 - 5.11 MIL/uL   Hemoglobin 14.0 12.0 - 15.0 g/dL   HCT 42.3 36.0 -  46.0 %   MCV 87.4 78.0 - 100.0 fL   MCH 28.9 26.0 - 34.0 pg   MCHC 33.1 30.0 - 36.0 g/dL   RDW 14.4 11.5 - 15.5 %   Platelets 184 150 - 400 K/uL  Comprehensive metabolic panel     Status: Abnormal   Collection Time: 06/05/14  6:25 PM  Result Value Ref Range   Sodium 137 135 - 145 mmol/L   Potassium 3.7 3.5 - 5.1 mmol/L   Chloride 103 96 - 112 mmol/L   CO2 27 19 - 32 mmol/L   Glucose, Bld 82 70 - 99 mg/dL   BUN 9 6 - 23 mg/dL   Creatinine, Ser 0.78 0.50 - 1.10 mg/dL   Calcium 8.6 8.4 - 10.5 mg/dL   Total Protein 5.9 (L) 6.0 - 8.3 g/dL   Albumin 3.0 (L) 3.5 - 5.2 g/dL   AST 143 (H) 0 - 37 U/L   ALT 365 (H) 0 - 35 U/L   Alkaline Phosphatase 115 39 - 117 U/L   Total Bilirubin 0.6 0.3 - 1.2 mg/dL   GFR calc non Af Amer >90 >90 mL/min   GFR calc Af Amer >90 >90 mL/min    Comment: (NOTE) The eGFR has been calculated using the CKD EPI equation. This calculation has not been validated in all clinical situations. eGFR's persistently <90 mL/min signify possible Chronic Kidney Disease.    Anion gap 7 5 - 15  Protein / creatinine ratio, urine      Status: Abnormal   Collection Time: 06/05/14  9:47 PM  Result Value Ref Range   Creatinine, Urine 37.31 mg/dL   Total Protein, Urine 45 mg/dL    Comment: NO NORMAL RANGE ESTABLISHED FOR THIS TEST   Protein Creatinine Ratio 1.21 (H) 0.00 - 0.15  MRSA PCR Screening     Status: None   Collection Time: 06/06/14  3:00 AM  Result Value Ref Range   MRSA by PCR NEGATIVE NEGATIVE    Comment:        The GeneXpert MRSA Assay (FDA approved for NASAL specimens only), is one component of a comprehensive MRSA colonization surveillance program. It is not intended to diagnose MRSA infection nor to guide or monitor treatment for MRSA infections.   CBC     Status: None   Collection Time: 06/07/14  5:56 AM  Result Value Ref Range   WBC 10.5 4.0 - 10.5 K/uL   RBC 4.74 3.87 - 5.11 MIL/uL   Hemoglobin 13.6 12.0 - 15.0 g/dL   HCT 41.4 36.0 - 46.0 %   MCV 87.3 78.0 - 100.0 fL   MCH 28.7 26.0 - 34.0 pg   MCHC 32.9 30.0 - 36.0 g/dL   RDW 14.7 11.5 - 15.5 %   Platelets 177 150 - 400 K/uL  Comprehensive metabolic panel     Status: Abnormal   Collection Time: 06/07/14  5:56 AM  Result Value Ref Range   Sodium 143 135 - 145 mmol/L   Potassium 3.8 3.5 - 5.1 mmol/L   Chloride 112 96 - 112 mmol/L   CO2 25 19 - 32 mmol/L   Glucose, Bld 101 (H) 70 - 99 mg/dL   BUN 12 6 - 23 mg/dL   Creatinine, Ser 0.67 0.50 - 1.10 mg/dL   Calcium 7.3 (L) 8.4 - 10.5 mg/dL   Total Protein 5.7 (L) 6.0 - 8.3 g/dL   Albumin 2.9 (L) 3.5 - 5.2 g/dL   AST 32 0 - 37 U/L  ALT 171 (H) 0 - 35 U/L   Alkaline Phosphatase 106 39 - 117 U/L   Total Bilirubin 0.6 0.3 - 1.2 mg/dL   GFR calc non Af Amer >90 >90 mL/min   GFR calc Af Amer >90 >90 mL/min    Comment: (NOTE) The eGFR has been calculated using the CKD EPI equation. This calculation has not been validated in all clinical situations. eGFR's persistently <90 mL/min signify possible Chronic Kidney Disease.    Anion gap 6 5 - 15  Uric acid     Status: None    Collection Time: 06/07/14  5:56 AM  Result Value Ref Range   Uric Acid, Serum 6.1 2.4 - 7.0 mg/dL  Lactate dehydrogenase     Status: Abnormal   Collection Time: 06/07/14  5:56 AM  Result Value Ref Range   LDH 290 (H) 94 - 250 U/L  Urinalysis, Routine w reflex microscopic     Status: Abnormal   Collection Time: 06/08/14  7:20 PM  Result Value Ref Range   Color, Urine YELLOW YELLOW   APPearance CLEAR CLEAR   Specific Gravity, Urine 1.015 1.005 - 1.030   pH 6.0 5.0 - 8.0   Glucose, UA NEGATIVE NEGATIVE mg/dL   Hgb urine dipstick MODERATE (A) NEGATIVE   Bilirubin Urine NEGATIVE NEGATIVE   Ketones, ur NEGATIVE NEGATIVE mg/dL   Protein, ur NEGATIVE NEGATIVE mg/dL   Urobilinogen, UA 0.2 0.0 - 1.0 mg/dL   Nitrite NEGATIVE NEGATIVE   Leukocytes, UA MODERATE (A) NEGATIVE  Protein / creatinine ratio, urine     Status: Abnormal   Collection Time: 06/08/14  7:20 PM  Result Value Ref Range   Creatinine, Urine 26.00 mg/dL   Total Protein, Urine 24 mg/dL    Comment: NO NORMAL RANGE ESTABLISHED FOR THIS TEST   Protein Creatinine Ratio 0.92 (H) 0.00 - 0.15  Urine microscopic-add on     Status: Abnormal   Collection Time: 06/08/14  7:20 PM  Result Value Ref Range   Squamous Epithelial / LPF FEW (A) RARE   WBC, UA 7-10 <3 WBC/hpf   RBC / HPF 11-20 <3 RBC/hpf   Bacteria, UA FEW (A) RARE  CBC with Differential/Platelet     Status: Abnormal   Collection Time: 06/08/14  8:30 PM  Result Value Ref Range   WBC 11.6 (H) 4.0 - 10.5 K/uL   RBC 5.03 3.87 - 5.11 MIL/uL   Hemoglobin 14.8 12.0 - 15.0 g/dL   HCT 82.8 83.3 - 74.4 %   MCV 87.7 78.0 - 100.0 fL   MCH 29.4 26.0 - 34.0 pg   MCHC 33.6 30.0 - 36.0 g/dL   RDW 51.4 60.4 - 79.9 %   Platelets 180 150 - 400 K/uL   Neutrophils Relative % 75 43 - 77 %   Neutro Abs 8.7 (H) 1.7 - 7.7 K/uL   Lymphocytes Relative 16 12 - 46 %   Lymphs Abs 1.9 0.7 - 4.0 K/uL   Monocytes Relative 9 3 - 12 %   Monocytes Absolute 1.0 0.1 - 1.0 K/uL   Eosinophils  Relative 0 0 - 5 %   Eosinophils Absolute 0.1 0.0 - 0.7 K/uL   Basophils Relative 0 0 - 1 %   Basophils Absolute 0.0 0.0 - 0.1 K/uL  Comprehensive metabolic panel     Status: Abnormal   Collection Time: 06/08/14  8:30 PM  Result Value Ref Range   Sodium 139 135 - 145 mmol/L   Potassium 4.1 3.5 - 5.1 mmol/L  Chloride 106 96 - 112 mmol/L   CO2 24 19 - 32 mmol/L   Glucose, Bld 95 70 - 99 mg/dL   BUN 10 6 - 23 mg/dL   Creatinine, Ser 0.68 0.50 - 1.10 mg/dL   Calcium 8.9 8.4 - 10.5 mg/dL   Total Protein 7.6 6.0 - 8.3 g/dL   Albumin 3.7 3.5 - 5.2 g/dL   AST 40 (H) 0 - 37 U/L   ALT 146 (H) 0 - 35 U/L   Alkaline Phosphatase 135 (H) 39 - 117 U/L   Total Bilirubin 1.2 0.3 - 1.2 mg/dL   GFR calc non Af Amer >90 >90 mL/min   GFR calc Af Amer >90 >90 mL/min    Comment: (NOTE) The eGFR has been calculated using the CKD EPI equation. This calculation has not been validated in all clinical situations. eGFR's persistently <90 mL/min signify possible Chronic Kidney Disease.    Anion gap 9 5 - 15  Lactate dehydrogenase     Status: Abnormal   Collection Time: 06/08/14  8:30 PM  Result Value Ref Range   LDH 422 (H) 94 - 250 U/L  Uric acid     Status: None   Collection Time: 06/08/14  8:30 PM  Result Value Ref Range   Uric Acid, Serum 5.1 2.4 - 7.0 mg/dL  TSH     Status: None   Collection Time: 06/08/14  8:30 PM  Result Value Ref Range   TSH 2.177 0.350 - 4.500 uIU/mL    Comment: Performed at [place] Results (Last 48 hours)    Dg Chest 2 View  06/04/2014 CLINICAL DATA: Difficulty breathing and bradycardia. Mid sternal chest pressure for 1 day. Patient is 6 days postpartum EXAM: CHEST 2 VIEW COMPARISON: Chest CT June 04, 2014 FINDINGS: The heart is mildly enlarged with pulmonary vascularity within normal limits. There is a small left effusion. There is no frank edema or consolidation. No adenopathy. No bone lesions. IMPRESSION: Mild cardiomegaly with small  left effusion. No edema or consolidation. These findings potentially may indicate that this patient is at risk for post partum cardiomyopathy. Appropriate evaluation in this regard advised. Electronically Signed By: Lowella Grip M.D. On: 06/04/2014 19:04   Ct Head Wo Contrast  06/05/2014 CLINICAL DATA: Acute onset severe headache EXAM: CT HEAD WITHOUT CONTRAST TECHNIQUE: Contiguous axial images were obtained from the base of the skull through the vertex without intravenous contrast. COMPARISON: None. FINDINGS: The ventricles are normal in size and configuration. There is no mass, hemorrhage, extra-axial fluid collection, or midline shift. Gray-white compartments are normal. No acute infarct apparent. The bony calvarium appears intact. The visualized mastoid air cells are clear. IMPRESSION: Study within normal limits. Electronically Signed By: Lowella Grip M.D. On: 06/05/2014 19:26   Ct Angio Chest Pe W/cm &/or Wo Cm  06/04/2014 CLINICAL DATA: Chest pain and shortness of breath for 2 days. The patient is 6 days postpartum. EXAM: CT ANGIOGRAPHY CHEST WITH CONTRAST TECHNIQUE: Multidetector CT imaging of the chest was performed using the standard protocol during bolus administration of intravenous contrast. Multiplanar CT image reconstructions and MIPs were obtained to evaluate the vascular anatomy. CONTRAST: 75 mL OMNIPAQUE IOHEXOL 350 MG/ML SOLN COMPARISON: None. FINDINGS: The study is somewhat limited due to bolus timing. No pulmonary embolus is identified. There are small bilateral pleural effusions. Heart size is mildly enlarged. No pericardial effusion is identified. No axillary, hilar or mediastinal lymphadenopathy. The lungs demonstrate mild dependent atelectasis. Visualized upper abdomen shows no  focal abnormality. No focal bony abnormality is identified. Review of the MIP images confirms the above findings. IMPRESSION: Negative for pulmonary embolus. Small  bilateral pleural effusions. Mild cardiomegaly. Electronically Signed By: Inge Rise M.D. On: 06/04/2014 18:47    Physical exam. No edema Hyper active reflexed No clones No URQ pain No vision changes   Assessment: 34 y.o. Female 11 days PP PreEclampsia Thyroid labs pending   Plan: Admit to AICU per Dr.T. Jelissa Espiritu PreEclampsia Orders  Okay for clear Diet IV Hydralazine $RemoveBefore'10mg'[id number]$  IV q2 for systolic greater than 038 or diastolic greater than 333 No Labetalol d/t recent bradycardia  MgSO4 4gm bolus then Infusion at 2gr/hr Chest xray to ro pulmonary edema  [facility] Standard CNM   I personally evaluated Mrs. Suderman while she was in the maternity admissions unit. She reports the sudden onset of severe headache at 1700 this evening. Upon arrival her blood pressure was 202/112. She received 20 mg of IV hydralazine which was successful in reducing her blood pressure. Her headache improved once her blood pressure was reduced. Her history is significant for admission to [facility], on 06/04/14 for chest pain and bradycardia. She was diagnosed with  Cardiomegaly , stabilized and discharged home. An order for an outpatient echocardiogram was placed prior to her discharge.  On 06/06/14 she was reamitted to [facility] for management of postpartum preeclampsia based on severe range blood pressures, severe headache and elevated liver enzymes. . She was given magnesium sulfate 6 gram bolus and 2 grams hour for 24 hours of therapy. She was discharge on 06/07/2013.   Mrs. Cwikla also reports significant orthopnea for the last several days. She is unable to lie flat due to difficulty breathing.   Physical Exam : General: Alerted and Oriented. No current acute distress. Lungs Minimal crackles at bases bilaterally no wheezes CV bradycardic regular rhythm.  Abdomen soft non tender non distended bowel sounds present  Extremities Hyperreflexic bilaterally . No edema is present  A/P 1 POD #11 with  postpartum preeclampsia with recurrent severe features of severe headache/ severe range BP and hyperreflexia. ... Will admit for another course of magnesium sulfate. Hydralazine IV 10 mg iv every 2 hours prn for severe range blood pressures.Avoid labetalol given bradycardia  2. Orthopnea .Marland Kitchen Chest xray is clear. No evidence of pulmonary edema.  I am concerned that she may have an underlying postpartum  cardiomyopathy given her bradycardia and orthopnea. . Follow up with Echocardiogram . Cardiology consult in AM or outpatient if stabilized.   Dr. Christophe Louis cross covering for [facility] until 06/09/2014 at 7 am. Report will be Given to Dr. Crawford Givens at that time

## 2014-06-08 NOTE — MAU Provider Note (Signed)
Addendum Pt feel much better, rating her HA 3/10 down from 8/10 Filed Vitals:   06/08/14 2238 06/08/14 2248 06/08/14 2259 06/08/14 2316  BP: 154/76 119/63 113/63 112/65  Pulse: 63 65 63 64  Temp:      TempSrc:      Resp:      Weight:      SpO2: 99% 100%  99%   Xray report FINDINGS: Normal heart size and mediastinal contours. No acute infiltrate or edema. No effusion or pneumothorax. No acute osseous findings.  IMPRESSION: Negative chest.

## 2014-06-08 NOTE — MAU Note (Signed)
Begin headache protocol.

## 2014-06-08 NOTE — MAU Note (Signed)
Pt had spontaneous vaginal delivery on 1/24 and has been in and out of hospital for headache and chest discomfort. Pt was just discharged from [facility] yesterday after being on Magnesium Sulfate for 24 hours. Pt states that headache was not present when she left yesterday but slowly started to come back this afternoon and is now very uncomfortable. Pt denies visual disturbances and nausea vomiting.

## 2014-06-08 NOTE — MAU Provider Note (Signed)
MAU Addendum Note  Results for orders placed or performed during the hospital encounter of 06/08/14 (from the past 24 hour(s))  Urinalysis, Routine w reflex microscopic     Status: Abnormal   Collection Time: 06/08/14  7:20 PM  Result Value Ref Range   Color, Urine YELLOW YELLOW   APPearance CLEAR CLEAR   Specific Gravity, Urine 1.015 1.005 - 1.030   pH 6.0 5.0 - 8.0   Glucose, UA NEGATIVE NEGATIVE mg/dL   Hgb urine dipstick MODERATE (A) NEGATIVE   Bilirubin Urine NEGATIVE NEGATIVE   Ketones, ur NEGATIVE NEGATIVE mg/dL   Protein, ur NEGATIVE NEGATIVE mg/dL   Urobilinogen, UA 0.2 0.0 - 1.0 mg/dL   Nitrite NEGATIVE NEGATIVE   Leukocytes, UA MODERATE (A) NEGATIVE  Protein / creatinine ratio, urine     Status: Abnormal   Collection Time: 06/08/14  7:20 PM  Result Value Ref Range   Creatinine, Urine 26.00 mg/dL   Total Protein, Urine 24 mg/dL   Protein Creatinine Ratio 0.92 (H) 0.00 - 0.15  Urine microscopic-add on     Status: Abnormal   Collection Time: 06/08/14  7:20 PM  Result Value Ref Range   Squamous Epithelial / LPF FEW (A) RARE   WBC, UA 7-10 <3 WBC/hpf   RBC / HPF 11-20 <3 RBC/hpf   Bacteria, UA FEW (A) RARE  CBC with Differential/Platelet     Status: Abnormal   Collection Time: 06/08/14  8:30 PM  Result Value Ref Range   WBC 11.6 (H) 4.0 - 10.5 K/uL   RBC 5.03 3.87 - 5.11 MIL/uL   Hemoglobin 14.8 12.0 - 15.0 g/dL   HCT 44.1 36.0 - 46.0 %   MCV 87.7 78.0 - 100.0 fL   MCH 29.4 26.0 - 34.0 pg   MCHC 33.6 30.0 - 36.0 g/dL   RDW 14.7 11.5 - 15.5 %   Platelets 180 150 - 400 K/uL   Neutrophils Relative % 75 43 - 77 %   Neutro Abs 8.7 (H) 1.7 - 7.7 K/uL   Lymphocytes Relative 16 12 - 46 %   Lymphs Abs 1.9 0.7 - 4.0 K/uL   Monocytes Relative 9 3 - 12 %   Monocytes Absolute 1.0 0.1 - 1.0 K/uL   Eosinophils Relative 0 0 - 5 %   Eosinophils Absolute 0.1 0.0 - 0.7 K/uL   Basophils Relative 0 0 - 1 %   Basophils Absolute 0.0 0.0 - 0.1 K/uL  Comprehensive metabolic panel      Status: Abnormal   Collection Time: 06/08/14  8:30 PM  Result Value Ref Range   Sodium 139 135 - 145 mmol/L   Potassium 4.1 3.5 - 5.1 mmol/L   Chloride 106 96 - 112 mmol/L   CO2 24 19 - 32 mmol/L   Glucose, Bld 95 70 - 99 mg/dL   BUN 10 6 - 23 mg/dL   Creatinine, Ser 0.68 0.50 - 1.10 mg/dL   Calcium 8.9 8.4 - 10.5 mg/dL   Total Protein 7.6 6.0 - 8.3 g/dL   Albumin 3.7 3.5 - 5.2 g/dL   AST 40 (H) 0 - 37 U/L   ALT 146 (H) 0 - 35 U/L   Alkaline Phosphatase 135 (H) 39 - 117 U/L   Total Bilirubin 1.2 0.3 - 1.2 mg/dL   GFR calc non Af Amer >90 >90 mL/min   GFR calc Af Amer >90 >90 mL/min   Anion gap 9 5 - 15  Lactate dehydrogenase     Status: Abnormal  Collection Time: 06/08/14  8:30 PM  Result Value Ref Range   LDH 422 (H) 94 - 250 U/L  Uric acid     Status: None   Collection Time: 06/08/14  8:30 PM  Result Value Ref Range   Uric Acid, Serum 5.1 2.4 - 7.0 mg/dL   Filed Vitals:   06/08/14 2045 06/08/14 2053 06/08/14 2100 06/08/14 2130  BP: 172/95  161/82 150/77  Pulse:      Temp:      TempSrc:      Resp:      Weight:  205 lb 4 oz (93.101 kg)     Chest xray pending    Plan: Admit to ICU Mag 4g bolus, followed by 2g hr x 24 hours Strict I&O Hydralazine for BP in severe range   Consulted with Dr. Zada Finders Dana [name], CNM, MSN 06/08/2014. 9:38 PM

## 2014-06-08 NOTE — MAU Provider Note (Signed)
MAU Addendum Note  No results found for this or any previous visit (from the past 24 hour(s)). Filed Vitals:   06/08/14 1939 06/08/14 1948 06/08/14 2020 06/08/14 2032  BP: 201/102 188/95 202/111 202/111  Pulse:      Temp:      TempSrc:      Resp:       Dr Landry Mellow consulted  Plan: Hydralazine 20mg  IV now Free T4 TSH Recheck BP in 15 minute   Rox Mcgriff, CNM, MSN 06/08/2014. 8:35 PM

## 2014-06-09 ENCOUNTER — Encounter (HOSPITAL_COMMUNITY): Payer: Self-pay | Admitting: *Deleted

## 2014-06-09 DIAGNOSIS — R0989 Other specified symptoms and signs involving the circulatory and respiratory systems: Secondary | ICD-10-CM | POA: Diagnosis present

## 2014-06-09 DIAGNOSIS — O1490 Unspecified pre-eclampsia, unspecified trimester: Secondary | ICD-10-CM

## 2014-06-09 DIAGNOSIS — I158 Other secondary hypertension: Secondary | ICD-10-CM

## 2014-06-09 DIAGNOSIS — O903 Peripartum cardiomyopathy: Secondary | ICD-10-CM

## 2014-06-09 DIAGNOSIS — I340 Nonrheumatic mitral (valve) insufficiency: Secondary | ICD-10-CM

## 2014-06-09 HISTORY — DX: Other specified symptoms and signs involving the circulatory and respiratory systems: R09.89

## 2014-06-09 LAB — COMPREHENSIVE METABOLIC PANEL
ALK PHOS: 115 U/L (ref 39–117)
ALT: 106 U/L — AB (ref 0–35)
ANION GAP: 4 — AB (ref 5–15)
AST: 25 U/L (ref 0–37)
Albumin: 2.9 g/dL — ABNORMAL LOW (ref 3.5–5.2)
BILIRUBIN TOTAL: 0.4 mg/dL (ref 0.3–1.2)
BUN: 15 mg/dL (ref 6–23)
CALCIUM: 7.9 mg/dL — AB (ref 8.4–10.5)
CO2: 24 mmol/L (ref 19–32)
Chloride: 111 mmol/L (ref 96–112)
Creatinine, Ser: 0.61 mg/dL (ref 0.50–1.10)
GFR calc Af Amer: >90 mL/min (ref >90)
GFR calc non Af Amer: >90 mL/min (ref >90)
Glucose, Bld: 205 mg/dL — ABNORMAL HIGH (ref 70–99)
Potassium: 3.6 mmol/L (ref 3.5–5.1)
SODIUM: 139 mmol/L (ref 135–145)
TOTAL PROTEIN: 5.2 g/dL — AB (ref 6.0–8.3)

## 2014-06-09 LAB — CBC
HEMATOCRIT: 40.3 % (ref 36.0–46.0)
Hemoglobin: 13.2 g/dL (ref 12.0–15.0)
MCH: 28.6 pg (ref 26.0–34.0)
MCHC: 32.8 g/dL (ref 30.0–36.0)
MCV: 87.4 fL (ref 78.0–100.0)
Platelets: 189 10*3/uL (ref 150–400)
RBC: 4.61 MIL/uL (ref 3.87–5.11)
RDW: 14.7 % (ref 11.5–15.5)
WBC: 11.4 10*3/uL — ABNORMAL HIGH (ref 4.0–10.5)

## 2014-06-09 LAB — MAGNESIUM: Magnesium: 4.2 mg/dL — ABNORMAL HIGH (ref 1.5–2.5)

## 2014-06-09 LAB — T4, FREE: Free T4: 1.22 ng/dL (ref 0.80–1.80)

## 2014-06-09 MED ORDER — SODIUM CHLORIDE 0.9 % IJ SOLN: 3.0000 mL | INTRAMUSCULAR | Status: DC | PRN

## 2014-06-09 MED ORDER — LOSARTAN POTASSIUM 25 MG PO TABS
25.0000 mg | ORAL_TABLET | Freq: Every day | ORAL | Status: DC
  Administered 2014-06-09 – 2014-06-10 (×2): 25 mg via ORAL
  Filled 2014-06-09 (×3): qty 1

## 2014-06-09 MED ORDER — SODIUM CHLORIDE 0.9 % IV SOLN: 250.0000 mL | INTRAVENOUS | Status: DC | PRN

## 2014-06-09 MED ORDER — HYDROCHLOROTHIAZIDE 12.5 MG PO CAPS
12.5000 mg | ORAL_CAPSULE | Freq: Every day | ORAL | Status: DC
Start: 2014-06-09 — End: 2014-06-11
  Administered 2014-06-09 – 2014-06-11 (×3): 12.5 mg via ORAL
  Filled 2014-06-09 (×4): qty 1

## 2014-06-09 MED ORDER — SODIUM CHLORIDE 0.9 % IJ SOLN
3.0000 mL | Freq: Two times a day (BID) | INTRAMUSCULAR | Status: DC
  Administered 2014-06-10: 3 mL via INTRAVENOUS

## 2014-06-09 NOTE — Progress Notes (Signed)
*  PRELIMINARY RESULTS* Echocardiogram 2D Echocardiogram has been performed.  Leavy Cella 06/09/2014, 4:45 PM

## 2014-06-09 NOTE — Consult Note (Signed)
CARDIOLOGY CONSULTATION NOTE.  NAME:  Dana [name]   MRN: [id number] DOB:  1981-01-16   ADMIT DATE: 06/08/2014  Reason for Consult: Peripartum hypertension with preeclampsia and concern for possible cardiomyopathy  Requesting Physician: Dr. Charlesetta Garibaldi, Dr. Landry Mellow - from [facility]  Primary Cardiologist: Was scheduled to see Dr. Dorris Carnes tomorrow. Will need to be rescheduled  HPI: This is a 34 y.o. female who is 11 days postpartum (delivered on 05/29/2014) and presented with severe headache for what amounts to be her third postpartum hospitalization/ER visit. She was recently noted to have significant hypertension upon arrival. She was concerned about having hypertension and bradycardia (she is an Therapist, sports). She had been admitted on January 30 for chest pain and bradycardia. At that time a chest x-ray suggested mild cardiomegaly with some pulmonary edema. She was discharged shortly after that after stabilization and was then again readmitted on February 1 with severe hypertension. She is placed on magnesium for presumed preeclampsia. She was not discharged on antihypertensives. This time upon admission she had more of a headache and not as much dyspnea or chest pain/pressure. She says that the orthopnea that she had at that time was also improving. For the most part her symptoms currently are much improved as her blood pressure has now improved to less than 793 mmHg systolic.  We are consult and because of the chest x-ray suggesting cardiomegaly and mild pulmonary edema with hypertension for assistance with blood pressure management and possible cardiomyopathy. Unfortunately she was scheduled to see Dr. Harrington Challenger tomorrow for evaluation and was supposed apparently have an echocardiogram as an outpatient. These have not occurred because of her repeat hospitalizations. Currently she is not noticing any more headache symptoms, no dyspnea with rest or with minimal exertion that she is doing around the room. No PND or  orthopnea and her edema is improved dramatically. She denies any chest tightness or pressure now with rest or exertion. No rapid or irregular heartbeats. She had some mild bradycardia noted but has not had any dizziness or syncope/near syncope. She denies any fatigue or bloating symptoms.   PMHx:   Past Medical History  Diagnosis Date  . Ventral hernia 09/04/2011  . Thyroid disease   . Diabetes mellitus without complication     gestational diabetes  . Gestational diabetes   . Labile hypertension 06/09/2014   Past Surgical History  Procedure Laterality Date  . Wisdom tooth extraction  2001  . Eye surgery  2006    lasik - both eyes  . Ventral hernia repair  09/13/2011    Procedure: HERNIA REPAIR VENTRAL ADULT;  Surgeon: Imogene Burn. Georgette Dover, MD;  Location: [facility];  Service: General;  Laterality: N/A;  . Hernia repair    . Tubal ligation Bilateral 05/30/2014    Procedure: POST PARTUM TUBAL LIGATION;  Surgeon: Ena Dawley, MD;  Location: [facility];  Service: Gynecology;  Laterality: Bilateral;    FAMHx: Family History  Problem Relation Age of Onset  . Cancer Maternal Grandmother     lung  . Hypertension Maternal Grandmother   . Hypertension Mother   . Heart disease Maternal Grandfather     SOCHx:  reports that she has never smoked. She has never used smokeless tobacco. She reports that she does not drink alcohol or use illicit drugs.  History  Substance Use Topics  . Smoking status: Never Smoker   . Smokeless tobacco: Never Used  . Alcohol Use: No    ALLERGIES: No Known Allergies  ROS:  A comprehensive review of systems was performed:  Review of Systems  Constitutional: Negative for malaise/fatigue.  Eyes: Positive for blurred vision.  Respiratory: Positive for shortness of breath.   Cardiovascular: Positive for chest pain (not during this particular hospitalization), orthopnea (Notably improved) and leg swelling. Negative for PND.  Gastrointestinal:  Negative for blood in stool and melena.  Genitourinary: Negative for hematuria.  Musculoskeletal: Negative.   Neurological: Positive for headaches. Negative for dizziness.  Endo/Heme/Allergies: Does not bruise/bleed easily.  Psychiatric/Behavioral: Negative.   All other systems reviewed and are negative.  HOME MEDICATIONS: Prescriptions prior to admission  Medication Sig Dispense Refill Last Dose  . acetaminophen (TYLENOL) 500 MG tablet Take 1,000 mg by mouth every 6 (six) hours as needed (pain).   06/08/2014 at 1700  . ibuprofen (ADVIL,MOTRIN) 600 MG tablet Take 1 tablet (600 mg total) by mouth every 6 (six) hours as needed. (Patient taking differently: Take 600 mg by mouth every 6 (six) hours as needed (pain). ) 30 tablet 2 06/08/2014 at Unknown time  . oxyCODONE-acetaminophen (PERCOCET/ROXICET) 5-325 MG per tablet Take 1 tablet by mouth every 4 (four) hours as needed (for pain scale less than 7). 30 tablet 0 Past Week at Unknown time  . Prenatal Vit-Fe Fumarate-FA (PRENATAL MULTIVITAMIN) TABS tablet Take 1 tablet by mouth daily.    06/05/2014 at Unknown time    HOSPITAL MEDICATIONS: . ibuprofen  600 mg Oral 4 times per day  . prenatal multivitamin  1 tablet Oral Q1200  . sodium chloride  3 mL Intravenous Q12H  . Tdap  0.5 mL Intramuscular Once    VITALS: Blood pressure 154/90, pulse 64, temperature 98.7 F (37.1 C), temperature source Oral, resp. rate 16, height 5\' 5"  (1.651 m), weight 201 lb 8 oz (91.4 kg), SpO2 100 %, not currently breastfeeding.  PHYSICAL EXAM: General appearance: alert, cooperative, appears stated age and no distress HEENT: /AT, EOMI, MMM, anicteric sclera Neck: no adenopathy, no carotid bruit, supple, symmetrical, trachea midline and Mildly elevated JVP Lungs: clear to auscultation bilaterally, normal percussion bilaterally and Mild basal rales that cleared with cough Heart: RRR, normal S1 and S2. No obvious S3/S4 gallop. Soft HSM at LLSB and apex. Nondisplaced  PMI. Abdomen: Soft with mild postpartum tenderness. No HSM. Extremities: edema Trace bilaterally. Otherwise no clubbing or cyanosis Pulses: 2+ and symmetric Skin: Skin color, texture, turgor normal. No rashes or lesions Neurologic: Alert and oriented X 3, normal strength and tone. Normal symmetric reflexes. Normal coordination and gait  LABS: Results for orders placed or performed during the hospital encounter of 06/08/14 (from the past 24 hour(s))  CBC     Status: Abnormal   Collection Time: 06/09/14  5:25 AM  Result Value Ref Range   WBC 11.4 (H) 4.0 - 10.5 K/uL   RBC 4.61 3.87 - 5.11 MIL/uL   Hemoglobin 13.2 12.0 - 15.0 g/dL   HCT 40.3 36.0 - 46.0 %   MCV 87.4 78.0 - 100.0 fL   MCH 28.6 26.0 - 34.0 pg   MCHC 32.8 30.0 - 36.0 g/dL   RDW 14.7 11.5 - 15.5 %   Platelets 189 150 - 400 K/uL  Comprehensive metabolic panel     Status: Abnormal   Collection Time: 06/09/14  5:25 AM  Result Value Ref Range   Sodium 139 135 - 145 mmol/L   Potassium 3.6 3.5 - 5.1 mmol/L   Chloride 111 96 - 112 mmol/L   CO2 24 19 - 32 mmol/L   Glucose, Bld 205 (  H) 70 - 99 mg/dL   BUN 15 6 - 23 mg/dL   Creatinine, Ser 0.61 0.50 - 1.10 mg/dL   Calcium 7.9 (L) 8.4 - 10.5 mg/dL   Total Protein 5.2 (L) 6.0 - 8.3 g/dL   Albumin 2.9 (L) 3.5 - 5.2 g/dL   AST 25 0 - 37 U/L   ALT 106 (H) 0 - 35 U/L   Alkaline Phosphatase 115 39 - 117 U/L   Total Bilirubin 0.4 0.3 - 1.2 mg/dL   GFR calc non Af Amer >90 >90 mL/min   GFR calc Af Amer >90 >90 mL/min   Anion gap 4 (L) 5 - 15  Magnesium     Status: Abnormal   Collection Time: 06/09/14  5:25 AM  Result Value Ref Range   Magnesium 4.2 (H) 1.5 - 2.5 mg/dL    IMAGING: Dg Chest 2 View  06/08/2014   CLINICAL DATA:  Chest pain.  Recent vaginal delivery.  EXAM: CHEST  2 VIEW  COMPARISON:  06/04/2014  FINDINGS: Normal heart size and mediastinal contours. No acute infiltrate or edema. No effusion or pneumothorax. No acute osseous findings.  IMPRESSION: Negative chest.    Electronically Signed   By: Jorje Guild M.D.   On: 06/08/2014 22:12   Echocardiogram performed following visit 06/09/2014 - relatively normal with mild to moderate MR.  Normal LV size and function with EF 55-60%. No regional WMA. Mild-moderate MR with mild LA dilation. Minimal peak PA pressure elevation.  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 When I saw her it would appear that her symptoms have pretty much resolved.   She does have some labile hypertension and with the moderate MR on echocardiogram would consider afterload reduction.  She assures me that she will not be breast-feeding as it would appear that her breast milk production has "dried up ". In that case we can see likely safely use ARB.  I will start with a low-dose ARB for afterload reduction  since she is having some edema following her delivery which is likely related to volume overload from IV infusions, will add low-dose HCTZ for partial diuresis.   These medications can clearly be adjusted in the outpatient setting according to her blood pressure response and need for additional diuresis in the future.   Would need to have outpatient chemistry evaluation within 1-2 weeks after we initiate these antihypertensive agents.  I would recommend that she follow-up with Dr. Harrington Challenger in our cardiology clinic on [facility].   Time Spent Directly with Patient: 45 minutes plus an additional 20 minutes with chart  HARDING, Leonie Green, M.D., M.S. Interventional Cardiologist   Pager # [phone]

## 2014-06-09 NOTE — Consult Note (Signed)
     Consuled for HTN (Pre-Ecclampsia) Post-partum 11d,  Readmitted for HTN with H/A & elevated LFTs -- on Magnesium. Now BPs in ~130-140 range.  Concern was "bradycardia" (rates 40s-50s). Had orthopnea last PM - much improved with BP control.  She is not going to breast feed - so we should be OK with more standard antihypertensives.  Echo ordered. CXR last PM was notably improved compared to prior study - likely resolving volume overload post-delivery. Will use low dose HCTZ for HTN & mild diuresis.  With ? Possible Cardiomyopathy, will use low dose ARB for afterload reduction - avoid BB for now due to bradycardia.  Will known more after Echo.   She had to cancel her clinic appt @ [facility]  With Dr. Harrington Challenger (most appropriate to see her) -- would reschedule   Full Note to follow.   Leonie Man, MD

## 2014-06-09 NOTE — Progress Notes (Signed)
Dana [name]  Postpartum Day 12 PP PreEclampsia with Severe Features, Cardiomegaly,  LOS: 1  Subjective -Patient currently eating lunch and reports that she is "doing good."  Denies headache, visual disturbances, N/V, epigastric pain, SOB, or edema.   Objective  Filed Vitals:   06/09/14 0900 06/09/14 1000 06/09/14 1100 06/09/14 1200  BP: 145/77 150/80 140/74 147/80  Pulse: 60 67 75 65  Temp:    98.1 F (36.7 C)  TempSrc:    Oral  Resp: 18 18  18   Height:      Weight:      SpO2: 100% 100% 100% 100%   Results for orders placed or performed during the hospital encounter of 06/08/14 (from the past 24 hour(s))  Urinalysis, Routine w reflex microscopic     Status: Abnormal   Collection Time: 06/08/14  7:20 PM  Result Value Ref Range   Color, Urine YELLOW YELLOW   APPearance CLEAR CLEAR   Specific Gravity, Urine 1.015 1.005 - 1.030   pH 6.0 5.0 - 8.0   Glucose, UA NEGATIVE NEGATIVE mg/dL   Hgb urine dipstick MODERATE (A) NEGATIVE   Bilirubin Urine NEGATIVE NEGATIVE   Ketones, ur NEGATIVE NEGATIVE mg/dL   Protein, ur NEGATIVE NEGATIVE mg/dL   Urobilinogen, UA 0.2 0.0 - 1.0 mg/dL   Nitrite NEGATIVE NEGATIVE   Leukocytes, UA MODERATE (A) NEGATIVE  Protein / creatinine ratio, urine     Status: Abnormal   Collection Time: 06/08/14  7:20 PM  Result Value Ref Range   Creatinine, Urine 26.00 mg/dL   Total Protein, Urine 24 mg/dL   Protein Creatinine Ratio 0.92 (H) 0.00 - 0.15  Urine microscopic-add on     Status: Abnormal   Collection Time: 06/08/14  7:20 PM  Result Value Ref Range   Squamous Epithelial / LPF FEW (A) RARE   WBC, UA 7-10 <3 WBC/hpf   RBC / HPF 11-20 <3 RBC/hpf   Bacteria, UA FEW (A) RARE  CBC with Differential/Platelet     Status: Abnormal   Collection Time: 06/08/14  8:30 PM  Result Value Ref Range   WBC 11.6 (H) 4.0 - 10.5 K/uL   RBC 5.03 3.87 - 5.11 MIL/uL   Hemoglobin 14.8 12.0 - 15.0 g/dL   HCT 44.1 36.0 - 46.0 %   MCV 87.7 78.0 - 100.0 fL   MCH  29.4 26.0 - 34.0 pg   MCHC 33.6 30.0 - 36.0 g/dL   RDW 14.7 11.5 - 15.5 %   Platelets 180 150 - 400 K/uL   Neutrophils Relative % 75 43 - 77 %   Neutro Abs 8.7 (H) 1.7 - 7.7 K/uL   Lymphocytes Relative 16 12 - 46 %   Lymphs Abs 1.9 0.7 - 4.0 K/uL   Monocytes Relative 9 3 - 12 %   Monocytes Absolute 1.0 0.1 - 1.0 K/uL   Eosinophils Relative 0 0 - 5 %   Eosinophils Absolute 0.1 0.0 - 0.7 K/uL   Basophils Relative 0 0 - 1 %   Basophils Absolute 0.0 0.0 - 0.1 K/uL  Comprehensive metabolic panel     Status: Abnormal   Collection Time: 06/08/14  8:30 PM  Result Value Ref Range   Sodium 139 135 - 145 mmol/L   Potassium 4.1 3.5 - 5.1 mmol/L   Chloride 106 96 - 112 mmol/L   CO2 24 19 - 32 mmol/L   Glucose, Bld 95 70 - 99 mg/dL   BUN 10 6 - 23 mg/dL   Creatinine,  Ser 0.68 0.50 - 1.10 mg/dL   Calcium 8.9 8.4 - 10.5 mg/dL   Total Protein 7.6 6.0 - 8.3 g/dL   Albumin 3.7 3.5 - 5.2 g/dL   AST 40 (H) 0 - 37 U/L   ALT 146 (H) 0 - 35 U/L   Alkaline Phosphatase 135 (H) 39 - 117 U/L   Total Bilirubin 1.2 0.3 - 1.2 mg/dL   GFR calc non Af Amer >90 >90 mL/min   GFR calc Af Amer >90 >90 mL/min   Anion gap 9 5 - 15  Lactate dehydrogenase     Status: Abnormal   Collection Time: 06/08/14  8:30 PM  Result Value Ref Range   LDH 422 (H) 94 - 250 U/L  Uric acid     Status: None   Collection Time: 06/08/14  8:30 PM  Result Value Ref Range   Uric Acid, Serum 5.1 2.4 - 7.0 mg/dL  T4, free     Status: None   Collection Time: 06/08/14  8:30 PM  Result Value Ref Range   Free T4 1.22 0.80 - 1.80 ng/dL  TSH     Status: None   Collection Time: 06/08/14  8:30 PM  Result Value Ref Range   TSH 2.177 0.350 - 4.500 uIU/mL  CBC     Status: Abnormal   Collection Time: 06/09/14  5:25 AM  Result Value Ref Range   WBC 11.4 (H) 4.0 - 10.5 K/uL   RBC 4.61 3.87 - 5.11 MIL/uL   Hemoglobin 13.2 12.0 - 15.0 g/dL   HCT 40.3 36.0 - 46.0 %   MCV 87.4 78.0 - 100.0 fL   MCH 28.6 26.0 - 34.0 pg   MCHC 32.8 30.0 -  36.0 g/dL   RDW 14.7 11.5 - 15.5 %   Platelets 189 150 - 400 K/uL  Comprehensive metabolic panel     Status: Abnormal   Collection Time: 06/09/14  5:25 AM  Result Value Ref Range   Sodium 139 135 - 145 mmol/L   Potassium 3.6 3.5 - 5.1 mmol/L   Chloride 111 96 - 112 mmol/L   CO2 24 19 - 32 mmol/L   Glucose, Bld 205 (H) 70 - 99 mg/dL   BUN 15 6 - 23 mg/dL   Creatinine, Ser 0.61 0.50 - 1.10 mg/dL   Calcium 7.9 (L) 8.4 - 10.5 mg/dL   Total Protein 5.2 (L) 6.0 - 8.3 g/dL   Albumin 2.9 (L) 3.5 - 5.2 g/dL   AST 25 0 - 37 U/L   ALT 106 (H) 0 - 35 U/L   Alkaline Phosphatase 115 39 - 117 U/L   Total Bilirubin 0.4 0.3 - 1.2 mg/dL   GFR calc non Af Amer >90 >90 mL/min   GFR calc Af Amer >90 >90 mL/min   Anion gap 4 (L) 5 - 15  Magnesium     Status: Abnormal   Collection Time: 06/09/14  5:25 AM  Result Value Ref Range   Magnesium 4.2 (H) 1.5 - 2.5 mg/dL   Physical Exam  Constitutional: She is oriented to person, place, and time. She appears well-developed and well-nourished. No distress.  HENT:  Head: Normocephalic and atraumatic.  Eyes: EOM are normal.  Neck: Normal range of motion.  Cardiovascular: Normal rate, regular rhythm and normal heart sounds.   Pulmonary/Chest: Effort normal and breath sounds normal.  Abdominal: Soft. Bowel sounds are normal.  Neurological: She is alert and oriented to person, place, and time.  Reflex Scores:  Bicep reflexes are 2+ on the right side and 2+ on the left side.      Patellar reflexes are 3+ on the right side and 3+ on the left side. Skin: Skin is warm and dry.  :    Assessment Postpartum Day 12 S/P SVD PP PreEclampsia Cardiomegaly  Plan - Will attempt to get echocardiogram while inpatient -Discontinue MgSO4 infusion tonight at 2240 -Patient requests to be discharged home with HTN medications--informed that MD will discuss, educate, and formulate appropriate POC -Dr. Sophronia Simas to follow  [facility], Francetta Found CNM, MSN 06/09/2014 12:23  PM

## 2014-06-10 ENCOUNTER — Encounter (HOSPITAL_COMMUNITY): Payer: Self-pay | Admitting: General Practice

## 2014-06-10 ENCOUNTER — Other Ambulatory Visit (HOSPITAL_COMMUNITY): Payer: BLUE CROSS/BLUE SHIELD

## 2014-06-10 ENCOUNTER — Encounter: Payer: BLUE CROSS/BLUE SHIELD | Admitting: Internal Medicine

## 2014-06-10 DIAGNOSIS — I340 Nonrheumatic mitral (valve) insufficiency: Secondary | ICD-10-CM

## 2014-06-10 DIAGNOSIS — I10 Essential (primary) hypertension: Secondary | ICD-10-CM

## 2014-06-10 MED ORDER — LOSARTAN POTASSIUM 50 MG PO TABS
100.0000 mg | ORAL_TABLET | Freq: Every day | ORAL | Status: DC
  Administered 2014-06-11: 100 mg via ORAL
  Filled 2014-06-10 (×3): qty 2

## 2014-06-10 MED ORDER — HYDROCHLOROTHIAZIDE 12.5 MG PO CAPS
12.5000 mg | ORAL_CAPSULE | Freq: Once | ORAL | Status: DC
  Filled 2014-06-10: qty 1

## 2014-06-10 MED ORDER — HYDROCHLOROTHIAZIDE 12.5 MG PO CAPS: 12.5000 mg | ORAL_CAPSULE | Freq: Every day | ORAL | Status: DC

## 2014-06-10 MED ORDER — LOSARTAN POTASSIUM 25 MG PO TABS: 25.0000 mg | ORAL_TABLET | Freq: Every day | ORAL | Status: DC

## 2014-06-10 MED ORDER — LOSARTAN POTASSIUM 25 MG PO TABS
25.0000 mg | ORAL_TABLET | Freq: Once | ORAL | Status: AC
  Administered 2014-06-10: 25 mg via ORAL
  Filled 2014-06-10: qty 1

## 2014-06-10 NOTE — Plan of Care (Signed)
Problem: Discharge Progression Outcomes Goal: Discharge plan in place and appropriate Outcome: Completed/Met Date Met:  06/10/14 Plans for discharge home.     

## 2014-06-10 NOTE — Progress Notes (Signed)
Patient to have at one a week home blood pressure checks by [employer] RN per Dr. Alesia Richards.  [employer] RN contacted.  RN spoke to [place] at [employer].  Stated that Marcene Corning was the patient's nurse.  Helene Kelp stated that she would contact Tammy Suggs to set up weekly at home blood pressures checks starting week of 06/13/2014.  [employer] to contact the patient.

## 2014-06-10 NOTE — Progress Notes (Addendum)
1340 - Patient's systolic BP elevated.  Dr. Alesia Richards notified.  Gave order for patient to receive the PRN dose of Hydralazine 10 mg IV.  Recheck blood pressure 20 minutes later.  If no improvement, administer a second dose of Hydralazine 10 mg IV.  If no improvement after second dose, call MD back.  Stated patient will not be discharged today.

## 2014-06-10 NOTE — Progress Notes (Addendum)
Patient's blood pressures reviewed with Dr. Alesia Richards over the phone.  Dr. Alesia Richards gave order for patient to receive one dose of Hydrochlorothiazide 12.5 mg po at 1800 this evening.  Hold medication if BP is less than 120/60.  Night shift nurse to call Dr. Alesia Richards with update at 2000.  Will continue to monitor.  Possible discharge this evening after 2000.  Pt aware.  Verbalizes understanding.

## 2014-06-10 NOTE — Discharge Summary (Addendum)
Obstetric Discharge Summary  Reason for Admission: Headache, postpartum preeclampsia.    Complications-Operative and Postpartum: Postpartum preeclampsia HEMOGLOBIN  Date Value Ref Range Status  06/09/2014 13.2 12.0 - 15.0 g/dL Final   HCT  Date Value Ref Range Status  06/09/2014 40.3 36.0 - 46.0 % Final   Hepatic Function Latest Ref Rng 06/09/2014 06/08/2014 06/07/2014  Total Protein 6.0 - 8.3 g/dL 5.2(L) 7.6 5.7(L)  Albumin 3.5 - 5.2 g/dL 2.9(L) 3.7 2.9(L)  AST 0 - 37 U/L 25 40(H) 32  ALT 0 - 35 U/L 106(H) 146(H) 171(H)  Alk Phosphatase 39 - 117 U/L 115 135(H) 106  Total Bilirubin 0.3 - 1.2 mg/dL 0.4 1.2 0.6     Hospital Progress:  Patient presented on June 08 2014 with complaints of severe headache. She had just been discharged  On 06/2014 after being managed for postpartum preeclampsia. On the current presentation she was found to have high blood pressures and magnesium sulfate was given for seizure prophylaxis. She had a workup including a chest x-ray that was normal and also a cardiac echo that showed normal ejection fraction. She was noted to have bradycardia that resolved.  Cardiology consultation was obtained and she was started on antihypertensive medication, cozaar and ALSO hydrochlorothiazide. Cozar dose was increased to 100 mg po BID to start on 06/11/14. Pt. Was originally planned for discharge on 06/10/14 but required IV hydralizine in the afternoon and a PM cozaar dose for elevated pressures.  She was seen by cardiologist in the evening who recommended keeping her overnight and further increasing her AM Cozaar dose.  On day of discharge today patient reported feeling better and did not have any complaints. She was deemed stable for discharge and she was advised to follow-up with her cardiologist.  [facility] will see patient for weekly BP checks starting 06/13/14.  I reviewed with the patient preeclamptic symptoms and signs and precautions.  D/C on 06/11/14 was OK'd by cardiology, as  well.    Filed Vitals:   06/11/14 0030 06/11/14 0214 06/11/14 0420 06/11/14 0635  BP: 134/68 131/72 149/89 151/89  Pulse: 56 59 54   Temp: 98.8 F (37.1 C)   98.6 F (37 C)  TempSrc: Oral   Oral  Resp: _0 Height:      Weight:      SpO2: 98% 99% 99%     Physical Exam:  Vitals: BP range: 131/68 to 151/89.  P 54 O2 sat 100% on RA.  General: alert, cooperative and no distress  CVS: s1, s2 RRR Pulm:CTAB Lochia: appropriate Uterine Fundus: firm Incision: None DVT Evaluation: No evidence of DVT seen on physical exam. No edema Ext: 2+ patellar reflex.   Discharge Diagnoses: postpartum preeclampsia   Bradycardia.  Hypertension.   Discharge Information: Date: 06/11/2014 Activity: unrestricted Diet: routine and Low-sodium diet Medications: Cozaar, hydrochlorothiazide, Percocet, Xanax Condition: stable Instructions: refer to practice specific booklet and Patient was advised to follow-up with the cardiologist within 1 week.  [facility] will see patient weekly for BP checks.  Addendum: Patient can get [facility] nurse visit, so will plan for smart [facility] nurse visit q week and patient does not to see [facility] in 2 weeks but she can keep her appointment at [facility] on 07/04/14 as already scheduled.  She may present to [facility] sooner if with worsening condition.     Discharge to: home Follow-up Information    Follow up with Dorris Carnes, MD.   Specialty:  Cardiology   Why:  Schedule follow-up appointment in one week   Contact information:   [place] [address] [phone]       Follow up with Alinda Dooms, MD On 07/04/2014.   Specialty:  Obstetrics and Gynecology   Why:  Follow-up appointment on 07/04/2014   Contact information:   [place] [address] [phone]       Follow up with [facility] RN.   Why:  Immunologist to make weekly home visits for blood pressure checks       LATHAM, VICKI 06/11/2014, 8:38  AM

## 2014-06-10 NOTE — Progress Notes (Signed)
Patient's blood pressure decreased after one dose of PRN Hydralazine (see flowsheet).  Will continue to monitor.

## 2014-06-10 NOTE — Progress Notes (Signed)
Pt. received IV hydralizine in the afternoon and a PM cozaar dose for elevated pressures. She was seen by cardiologist in the evening who recommended keeping her overnight and further increasing her AM Cozaar dose. I cancelled the discharge order. Will follow up with patient tomorrrow.  Filed Vitals:   06/10/14 1600 06/10/14 1654 06/10/14 1700 06/10/14 1800  BP: 157/84  172/89 173/79  Pulse: 59  57 53  Temp:  98.8 F (37.1 C)    TempSrc:  Oral    Resp:      Height:      Weight:      SpO2: 100%  100% 100%    Dr. Alesia Richards.

## 2014-06-10 NOTE — Progress Notes (Signed)
DAILY PROGRESS NOTE  Subjective:  HR remains low and bp is still elevated. Echo results are re-assuring - there is no cardiomyopathy. There is moderate MR, which is likely related to volume expansion and increased CO during pregnancy as well as high afterload from hypertension.  Objective:  Temp:  [97.9 F (36.6 C)-98.8 F (37.1 C)] 98.8 F (37.1 C) (02/05 1654) Pulse Rate:  [52-71] 53 (02/05 1800) Resp:  [14-18] 16 (02/05 1412) BP: (107-173)/(63-96) 173/79 mmHg (02/05 1800) SpO2:  [98 %-100 %] 100 % (02/05 1800) Weight:  [206 lb 14.4 oz (93.849 kg)] 206 lb 14.4 oz (93.849 kg) (02/05 0600) Weight change: 1 lb 10.4 oz (0.748 kg)  Intake/Output from previous day: 02/04 0701 - 02/05 0700 In: 4398.3 [P.O.:2440; I.V.:1958.3] Out: 3925 [Urine:3925]  Intake/Output from this shift: Total I/O In: 320 [P.O.:320] Out: 350 [Urine:350]  Medications: Current Facility-Administered Medications  Medication Dose Route Frequency Provider Last Rate Last Dose  . 0.9 %  sodium chloride infusion  250 mL Intravenous PRN Gavin Pound, CNM      . witch hazel-glycerin (TUCKS) pad 1 application  1 application Topical PRN Venus Standard, CNM       And  . dibucaine (NUPERCAINAL) 1 % rectal ointment 1 application  1 application Rectal PRN Venus Standard, CNM      . diphenhydrAMINE (BENADRYL) capsule 25 mg  25 mg Oral Q6H PRN Venus Standard, CNM      . hydrALAZINE (APRESOLINE) injection 10 mg  10 mg Intravenous Q2H PRN Venus Standard, CNM   10 mg at 06/10/14 1407  . hydrochlorothiazide (MICROZIDE) capsule 12.5 mg  12.5 mg Oral Daily Leonie Man, MD   12.5 mg at 06/10/14 1004  . ibuprofen (ADVIL,MOTRIN) tablet 600 mg  600 mg Oral 4 times per day Venus Standard, CNM   600 mg at 06/10/14 0606  . lanolin ointment   Topical PRN Venus Standard, CNM      . losartan (COZAAR) tablet 25 mg  25 mg Oral Daily Naima Dillard, MD   25 mg at 06/10/14 1004  . ondansetron (ZOFRAN) tablet 4 mg  4 mg Oral Q4H PRN  Venus Standard, CNM       Or  . ondansetron (ZOFRAN) injection 4 mg  4 mg Intravenous Q4H PRN Venus Standard, CNM      . oxyCODONE-acetaminophen (PERCOCET/ROXICET) 5-325 MG per tablet 1 tablet  1 tablet Oral Q4H PRN Venus Standard, CNM   1 tablet at 06/09/14 0116  . oxyCODONE-acetaminophen (PERCOCET/ROXICET) 5-325 MG per tablet 2 tablet  2 tablet Oral Q4H PRN Venus Standard, CNM      . prenatal multivitamin tablet 1 tablet  1 tablet Oral Q1200 Venus Standard, CNM   1 tablet at 06/10/14 1215  . simethicone (MYLICON) chewable tablet 80 mg  80 mg Oral PRN Venus Standard, CNM      . sodium chloride 0.9 % injection 3 mL  3 mL Intravenous Q12H Gavin Pound, CNM   3 mL at 06/09/14 2245  . sodium chloride 0.9 % injection 3 mL  3 mL Intravenous PRN Gavin Pound, CNM      . Tdap (BOOSTRIX) injection 0.5 mL  0.5 mL Intramuscular Once Venus Standard, CNM   0.5 mL at 06/09/14 1000  . zolpidem (AMBIEN) tablet 5 mg  5 mg Oral QHS PRN Venus Standard, CNM        Physical Exam: General appearance: alert and no distress Lungs: clear to auscultation bilaterally Heart: regular rate and rhythm, S1,  S2 normal, no murmur, click, rub or gallop Extremities: extremities normal, atraumatic, no cyanosis or edema  Lab Results: Results for orders placed or performed during the hospital encounter of 06/08/14 (from the past 48 hour(s))  Urinalysis, Routine w reflex microscopic     Status: Abnormal   Collection Time: 06/08/14  7:20 PM  Result Value Ref Range   Color, Urine YELLOW YELLOW   APPearance CLEAR CLEAR   Specific Gravity, Urine 1.015 1.005 - 1.030   pH 6.0 5.0 - 8.0   Glucose, UA NEGATIVE NEGATIVE mg/dL   Hgb urine dipstick MODERATE (A) NEGATIVE   Bilirubin Urine NEGATIVE NEGATIVE   Ketones, ur NEGATIVE NEGATIVE mg/dL   Protein, ur NEGATIVE NEGATIVE mg/dL   Urobilinogen, UA 0.2 0.0 - 1.0 mg/dL   Nitrite NEGATIVE NEGATIVE   Leukocytes, UA MODERATE (A) NEGATIVE  Protein / creatinine ratio, urine     Status:  Abnormal   Collection Time: 06/08/14  7:20 PM  Result Value Ref Range   Creatinine, Urine 26.00 mg/dL   Total Protein, Urine 24 mg/dL    Comment: NO NORMAL RANGE ESTABLISHED FOR THIS TEST   Protein Creatinine Ratio 0.92 (H) 0.00 - 0.15  Urine microscopic-add on     Status: Abnormal   Collection Time: 06/08/14  7:20 PM  Result Value Ref Range   Squamous Epithelial / LPF FEW (A) RARE   WBC, UA 7-10 <3 WBC/hpf   RBC / HPF 11-20 <3 RBC/hpf   Bacteria, UA FEW (A) RARE  CBC with Differential/Platelet     Status: Abnormal   Collection Time: 06/08/14  8:30 PM  Result Value Ref Range   WBC 11.6 (H) 4.0 - 10.5 K/uL   RBC 5.03 3.87 - 5.11 MIL/uL   Hemoglobin 14.8 12.0 - 15.0 g/dL   HCT 44.1 36.0 - 46.0 %   MCV 87.7 78.0 - 100.0 fL   MCH 29.4 26.0 - 34.0 pg   MCHC 33.6 30.0 - 36.0 g/dL   RDW 14.7 11.5 - 15.5 %   Platelets 180 150 - 400 K/uL   Neutrophils Relative % 75 43 - 77 %   Neutro Abs 8.7 (H) 1.7 - 7.7 K/uL   Lymphocytes Relative 16 12 - 46 %   Lymphs Abs 1.9 0.7 - 4.0 K/uL   Monocytes Relative 9 3 - 12 %   Monocytes Absolute 1.0 0.1 - 1.0 K/uL   Eosinophils Relative 0 0 - 5 %   Eosinophils Absolute 0.1 0.0 - 0.7 K/uL   Basophils Relative 0 0 - 1 %   Basophils Absolute 0.0 0.0 - 0.1 K/uL  Comprehensive metabolic panel     Status: Abnormal   Collection Time: 06/08/14  8:30 PM  Result Value Ref Range   Sodium 139 135 - 145 mmol/L   Potassium 4.1 3.5 - 5.1 mmol/L   Chloride 106 96 - 112 mmol/L   CO2 24 19 - 32 mmol/L   Glucose, Bld 95 70 - 99 mg/dL   BUN 10 6 - 23 mg/dL   Creatinine, Ser 0.68 0.50 - 1.10 mg/dL   Calcium 8.9 8.4 - 10.5 mg/dL   Total Protein 7.6 6.0 - 8.3 g/dL   Albumin 3.7 3.5 - 5.2 g/dL   AST 40 (H) 0 - 37 U/L   ALT 146 (H) 0 - 35 U/L   Alkaline Phosphatase 135 (H) 39 - 117 U/L   Total Bilirubin 1.2 0.3 - 1.2 mg/dL   GFR calc non Af Amer >90 >90 mL/min  GFR calc Af Amer >90 >90 mL/min    Comment: (NOTE) The eGFR has been calculated using the CKD EPI  equation. This calculation has not been validated in all clinical situations. eGFR's persistently <90 mL/min signify possible Chronic Kidney Disease.    Anion gap 9 5 - 15  Lactate dehydrogenase     Status: Abnormal   Collection Time: 06/08/14  8:30 PM  Result Value Ref Range   LDH 422 (H) 94 - 250 U/L  Uric acid     Status: None   Collection Time: 06/08/14  8:30 PM  Result Value Ref Range   Uric Acid, Serum 5.1 2.4 - 7.0 mg/dL  T4, free     Status: None   Collection Time: 06/08/14  8:30 PM  Result Value Ref Range   Free T4 1.22 0.80 - 1.80 ng/dL    Comment: Performed at [facility]  TSH     Status: None   Collection Time: 06/08/14  8:30 PM  Result Value Ref Range   TSH 2.177 0.350 - 4.500 uIU/mL    Comment: Performed at [facility]  CBC     Status: Abnormal   Collection Time: 06/09/14  5:25 AM  Result Value Ref Range   WBC 11.4 (H) 4.0 - 10.5 K/uL   RBC 4.61 3.87 - 5.11 MIL/uL   Hemoglobin 13.2 12.0 - 15.0 g/dL   HCT 40.3 36.0 - 46.0 %   MCV 87.4 78.0 - 100.0 fL   MCH 28.6 26.0 - 34.0 pg   MCHC 32.8 30.0 - 36.0 g/dL   RDW 14.7 11.5 - 15.5 %   Platelets 189 150 - 400 K/uL  Comprehensive metabolic panel     Status: Abnormal   Collection Time: 06/09/14  5:25 AM  Result Value Ref Range   Sodium 139 135 - 145 mmol/L   Potassium 3.6 3.5 - 5.1 mmol/L   Chloride 111 96 - 112 mmol/L   CO2 24 19 - 32 mmol/L   Glucose, Bld 205 (H) 70 - 99 mg/dL   BUN 15 6 - 23 mg/dL   Creatinine, Ser 0.61 0.50 - 1.10 mg/dL   Calcium 7.9 (L) 8.4 - 10.5 mg/dL   Total Protein 5.2 (L) 6.0 - 8.3 g/dL   Albumin 2.9 (L) 3.5 - 5.2 g/dL   AST 25 0 - 37 U/L   ALT 106 (H) 0 - 35 U/L   Alkaline Phosphatase 115 39 - 117 U/L   Total Bilirubin 0.4 0.3 - 1.2 mg/dL   GFR calc non Af Amer >90 >90 mL/min   GFR calc Af Amer >90 >90 mL/min    Comment: (NOTE) The eGFR has been calculated using the CKD EPI equation. This calculation has not been validated in all clinical situations. eGFR's  persistently <90 mL/min signify possible Chronic Kidney Disease.    Anion gap 4 (L) 5 - 15  Magnesium     Status: Abnormal   Collection Time: 06/09/14  5:25 AM  Result Value Ref Range   Magnesium 4.2 (H) 1.5 - 2.5 mg/dL    Imaging: Dg Chest 2 View  06/08/2014   CLINICAL DATA:  Chest pain.  Recent vaginal delivery.  EXAM: CHEST  2 VIEW  COMPARISON:  06/04/2014  FINDINGS: Normal heart size and mediastinal contours. No acute infiltrate or edema. No effusion or pneumothorax. No acute osseous findings.  IMPRESSION: Negative chest.   Electronically Signed   By: Jorje Guild M.D.   On: 06/08/2014 22:12  Assessment:  1. Principal Problem: 2.   Preeclampsia in postpartum period 3. Active Problems: 4.   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   Labile hypertension 6.   Mitral valvular regurgitation: Mild-moderate 7.   Plan:  BP remains elevated. Increase losartan to 100 mg daily tomorrow. Continue HCTZ.  Cardiology will follow-up this weekend to see if blood pressures have improved. Ultimately follow-up with midlevel or Dr. Harrington Challenger at the [facility].  Time Spent Directly with Patient:  15 minutes  Length of Stay:  LOS: 2 days   Pixie Casino, MD, [facility] Attending Cardiologist [facility]  Armend Hochstatter C 06/10/2014, 6:20 PM

## 2014-06-10 NOTE — Progress Notes (Signed)
Late entry:  Dr. Alesia Richards gave order for patient to have follow-up visit with cardiology in one week and to keep OB follow-up on 07/04/14.

## 2014-06-11 LAB — GLUCOSE, CAPILLARY: Glucose-Capillary: 73 mg/dL (ref 70–99)

## 2014-06-11 MED ORDER — ALPRAZOLAM 0.5 MG PO TABS: 0.5000 mg | ORAL_TABLET | Freq: Two times a day (BID) | ORAL | Status: DC | PRN

## 2014-06-11 MED ORDER — LOSARTAN POTASSIUM 100 MG PO TABS: 100.0000 mg | ORAL_TABLET | Freq: Two times a day (BID) | ORAL | Status: DC

## 2014-06-11 MED ORDER — LOSARTAN POTASSIUM 100 MG PO TABS: 100.0000 mg | ORAL_TABLET | Freq: Every day | ORAL | Status: DC

## 2014-06-11 NOTE — Progress Notes (Signed)
Spoke with Dr. Sharolyn Douglas, Cardiologist, about follow up plans for pt. MD confirmed that pt was clear to be discharged home, as she has a follow-up appointment with Dr. Harrington Challenger. Information was passed on to V. Latham and pt.

## 2014-06-11 NOTE — Progress Notes (Addendum)
Came by to see patient--d/c cancelled last night due to recommendations from cardiologist to observe overnight and increase Cozar dose to 100 mg daily, with first dose today.  Reports mild HA, no visual sx, and no epigastric pain.  Reports has anxiety sx when her BP goes up--requests med for prn use.    Has cardiology f/u scheduled, and [facility] RN visit scheduled weekly starting week of 2/8.  Filed Vitals:   06/11/14 0030 06/11/14 0214 06/11/14 0420 06/11/14 0635  BP: 134/68 131/72 149/89 151/89  Pulse: 56 59 54   Temp: 98.8 F (37.1 C)   98.6 F (37 C)  TempSrc: Oral   Oral  Resp: 18  16 18   Height:      Weight:      SpO2: 98% 99% 99%    BP range since MN:  134-151/68-89  Physical Exam: Chest clear Heart RRR without murmur, rate 54-56 Abd soft, NT Pelvic--uterus well-involuted, no current lochia Ext--DTR 1+, no clonus, no edema, negative Homan's.  . hydrochlorothiazide  12.5 mg Oral Daily  . ibuprofen  600 mg Oral 4 times per day  . losartan  100 mg Oral Daily  . prenatal multivitamin  1 tablet Oral Q1200  . sodium chloride  3 mL Intravenous Q12H  . Tdap  0.5 mL Intramuscular Once   Assessment: 14 days s/p SVB and pp BTL Postpartum pre-eclampsia Bradycardia Persistent hypertension  Plan: Anticipate d/c today--will consult with Dr. Alesia Richards. Rx Xanax 0.5 mg po BID prn on d/c Will need new Rx for Losartin/Cozar 100 mg po BID upon d/c. Rx HCTZ already sent yesterday.  Donnel Saxon, CNM 06/11/14 0830    I saw and examined patient and agree with above findings, assessment and plan.  Await cardiology reevaluation before discharge.  Dr. Alesia Richards.

## 2014-06-11 NOTE — Discharge Instructions (Signed)

## 2014-06-11 NOTE — Progress Notes (Signed)
Pt discharged to home with SO via W/C. Pt without complaints. VSS.  Discharge instructions and follow-up discussed and pt verbalized understanding.

## 2014-06-20 ENCOUNTER — Encounter: Payer: BLUE CROSS/BLUE SHIELD | Admitting: Physician Assistant

## 2014-06-26 NOTE — Progress Notes (Signed)
Cardiology Office Note   Date:  06/27/2014   ID:  Dana [name], DOB 10-24-1980, MRN [id number]  PCP:  Casilda Carls, MD  Cardiologist:   Dorris Carnes, MD   No chief complaint on file. The patinet presents for evaluation of BP      History of Present Illness: Dana [name] is a 34 y.o. female who presents for evaluation of hypertension   The patient delivered a healthy baby boy at the end of January   After delivery she presented to [facility] with hypertension, edema Had elevated LFTs  She was admitted this last time.  Cardiology was consulted.   Echo showed normal LVEF  Mild LAE  Mild to mod MR  Since d/c she has followed BP closely  Have run 110s / 70-80s for most of time  One time above `130  Took Cozaar and HCTZ that time Has not since   Breathing is OK  No CP  No SOB  Edema is gone  No HA       Current Outpatient Prescriptions  Medication Sig Dispense Refill  . ALPRAZolam (XANAX) 0.5 MG tablet Take 1 tablet (0.5 mg total) by mouth 2 (two) times daily as needed for anxiety. 36 tablet 0  . oxyCODONE-acetaminophen (PERCOCET/ROXICET) 5-325 MG per tablet Take 1 tablet by mouth every 4 (four) hours as needed (for pain scale less than 7). 30 tablet 0  . Prenatal Vit-Fe Fumarate-FA (PRENATAL MULTIVITAMIN) TABS tablet Take 1 tablet by mouth daily.     . hydrochlorothiazide (MICROZIDE) 12.5 MG capsule Take 1 capsule (12.5 mg total) by mouth daily. (Patient not taking: Reported on 06/27/2014) 30 capsule 1  . losartan (COZAAR) 100 MG tablet Take 1 tablet (100 mg total) by mouth daily. (Patient not taking: Reported on 06/27/2014) 30 tablet 2   No current facility-administered medications for this visit.    Allergies:   Review of patient's allergies indicates no known allergies.   Past Medical History  Diagnosis Date  . Ventral hernia 09/04/2011  . Thyroid disease   . Diabetes mellitus without complication     gestational diabetes  . Gestational diabetes   . Labile  hypertension 06/09/2014    Past Surgical History  Procedure Laterality Date  . Wisdom tooth extraction  2001  . Eye surgery  2006    lasik - both eyes  . Ventral hernia repair  09/13/2011    Procedure: HERNIA REPAIR VENTRAL ADULT;  Surgeon: Imogene Burn. Georgette Dover, MD;  Location: [place];  Service: General;  Laterality: N/A;  . Hernia repair    . Tubal ligation Bilateral 05/30/2014    Procedure: POST PARTUM TUBAL LIGATION;  Surgeon: Ena Dawley, MD;  Location: [facility];  Service: Gynecology;  Laterality: Bilateral;     Social History:  The patient  reports that she has never smoked. She has never used smokeless tobacco. She reports that she does not drink alcohol or use illicit drugs.   Family History:  The patient's family history includes Cancer in her maternal grandmother; Heart disease in her maternal grandfather; Hypertension in her maternal grandmother and mother.    ROS:  Please see the history of present illness. All other systems are reviewed and  Negative to the above problem except as noted.    PHYSICAL EXAM: VS:  BP 110/80 mmHg  Pulse 89  Ht 5\' 5"  (1.651 m)  Wt 188 lb 9.6 oz (85.548 kg)  BMI 31.38 kg/m2  SpO2 97%  GEN: Well nourished,  well developed, in no acute distress HEENT: normal Neck: no JVD, carotid bruits, or masses Cardiac: RRR; no murmurs, rubs, or gallops,no edema  Respiratory:  clear to auscultation bilaterally, normal work of breathing GI: soft, nontender, nondistended, + BS  No hepatomegaly  MS: no deformity Moving all extremities   Skin: warm and dry, no rash Neuro:  Strength and sensation are intact Psych: euthymic mood, full affect   EKG:  EKG is  Not ordered today.   Lipid Panel No results found for: CHOL, TRIG, HDL, CHOLHDL, VLDL, LDLCALC, LDLDIRECT    Wt Readings from Last 3 Encounters:  06/27/14 188 lb 9.6 oz (85.548 kg)  06/10/14 206 lb 14.4 oz (93.849 kg)  06/07/14 205 lb 9.6 oz (93.26 kg)      ASSESSMENT AND  PLAN: 1  HTN  I would continue to follow for the next few wks regularly  If OK can pull back  If high I would take 1/2 of cozaar F/U in Aug/september  2.  Mitral regurg  BP at time of echo noted to be 149 /  Will get f/u echo in august/september Rx BP as needed     Current medicines are reviewed at length with the patient today.  The patient does not have concerns regarding medicines.  The following changes have been made:   Labs/ tests ordered today include: No orders of the defined types were placed in this encounter.     Disposition:   FU with  in   Signed, Dorris Carnes, MD  06/27/2014 8:51 AM    [facility] [address], [place], [place]  [address] Phone: [phone]; Fax: [phone]

## 2014-06-27 ENCOUNTER — Ambulatory Visit (INDEPENDENT_AMBULATORY_CARE_PROVIDER_SITE_OTHER): Payer: BLUE CROSS/BLUE SHIELD | Admitting: Internal Medicine

## 2014-06-27 ENCOUNTER — Ambulatory Visit (INDEPENDENT_AMBULATORY_CARE_PROVIDER_SITE_OTHER): Payer: BLUE CROSS/BLUE SHIELD | Admitting: Family Medicine

## 2014-06-27 ENCOUNTER — Encounter: Payer: Self-pay | Admitting: Internal Medicine

## 2014-06-27 VITALS — BP 110/80 | HR 89 | Ht 65.0 in | Wt 188.6 lb

## 2014-06-27 VITALS — BP 108/82 | HR 78 | Temp 97.7°F | Resp 16 | Ht 65.0 in | Wt 188.2 lb

## 2014-06-27 DIAGNOSIS — H00013 Hordeolum externum right eye, unspecified eyelid: Secondary | ICD-10-CM

## 2014-06-27 DIAGNOSIS — H01003 Unspecified blepharitis right eye, unspecified eyelid: Secondary | ICD-10-CM

## 2014-06-27 DIAGNOSIS — I340 Nonrheumatic mitral (valve) insufficiency: Secondary | ICD-10-CM

## 2014-06-27 DIAGNOSIS — I158 Other secondary hypertension: Secondary | ICD-10-CM

## 2014-06-27 MED ORDER — OFLOXACIN 0.3 % OP SOLN: 2.0000 [drp] | Freq: Four times a day (QID) | OPHTHALMIC | Status: DC

## 2014-06-27 NOTE — Progress Notes (Signed)
Chief Complaint:  Chief Complaint  Patient presents with  . Eye Drainage    Painful also onset this AM    HPI: Dana [name] is a 34 y.o. female who is here for  Right eye redness and drainage since this morning She was using a different mascara and had not used it during pregnancy, she used it again this morning and started having redness and tearing by te end of the day Denies any vision changes, HA, pustular dc, she has had redness and swelling and pain  of the upper right eye lid. She has not had any fevers or chills. Has not ried anything for this.  She recently had a baby and has had post pregnancy complication so is not breast feeding.   She had gestational DM but no DM currently  Past Medical History  Diagnosis Date  . Ventral hernia 09/04/2011  . Thyroid disease   . Diabetes mellitus without complication     gestational diabetes  . Gestational diabetes   . Labile hypertension 06/09/2014   Past Surgical History  Procedure Laterality Date  . Wisdom tooth extraction  2001  . Eye surgery  2006    lasik - both eyes  . Ventral hernia repair  09/13/2011    Procedure: HERNIA REPAIR VENTRAL ADULT;  Surgeon: Imogene Burn. Georgette Dover, MD;  Location: [facility];  Service: General;  Laterality: N/A;  . Hernia repair    . Tubal ligation Bilateral 05/30/2014    Procedure: POST PARTUM TUBAL LIGATION;  Surgeon: Ena Dawley, MD;  Location: [facility];  Service: Gynecology;  Laterality: Bilateral;   History   Social History  . Marital Status: Married    Spouse Name: N/A  . Number of Children: N/A  . Years of Education: N/A   Social History Main Topics  . Smoking status: Never Smoker   . Smokeless tobacco: Never Used  . Alcohol Use: No  . Drug Use: No  . Sexual Activity: Not Currently    Birth Control/ Protection: Surgical   Other Topics Concern  . None   Social History Narrative   Family History  Problem Relation Age of Onset  . Cancer Maternal  Grandmother     lung  . Hypertension Maternal Grandmother   . Hypertension Mother   . Heart disease Maternal Grandfather    No Known Allergies Prior to Admission medications   Medication Sig Start Date End Date Taking? Authorizing Provider  ALPRAZolam Duanne Moron) 0.5 MG tablet Take 1 tablet (0.5 mg total) by mouth 2 (two) times daily as needed for anxiety. 06/11/14  Yes Donnel Saxon, CNM  Prenatal Vit-Fe Fumarate-FA (PRENATAL MULTIVITAMIN) TABS tablet Take 1 tablet by mouth daily.    Yes Historical Provider, MD  hydrochlorothiazide (MICROZIDE) 12.5 MG capsule Take 1 capsule (12.5 mg total) by mouth daily. Patient not taking: Reported on 06/27/2014 06/10/14   Alinda Dooms, MD  losartan (COZAAR) 100 MG tablet Take 1 tablet (100 mg total) by mouth daily. Patient not taking: Reported on 06/27/2014 06/11/14   Donnel Saxon, CNM  oxyCODONE-acetaminophen (PERCOCET/ROXICET) 5-325 MG per tablet Take 1 tablet by mouth every 4 (four) hours as needed (for pain scale less than 7). Patient not taking: Reported on 06/27/2014 05/31/14   Donnel Saxon, CNM     ROS: The patient denies fevers, chills, night sweats, unintentional weight loss, chest pain, palpitations, wheezing, dyspnea on exertion, nausea, vomiting, abdominal pain, dysuria, hematuria, melena, numbness, weakness, or tingling.  All other  systems have been reviewed and were otherwise negative with the exception of those mentioned in the HPI and as above.    PHYSICAL EXAM: Filed Vitals:   06/27/14 1208  BP: 108/82  Pulse: 78  Temp: 97.7 F (36.5 C)  Resp: 16   Filed Vitals:   06/27/14 1208  Height: 5\' 5"  (1.651 m)  Weight: 188 lb 3.2 oz (85.367 kg)   Body mass index is 31.32 kg/(m^2).  General: Alert, no acute distress HEENT:  Normocephalic, atraumatic, oropharynx patent. EOMI, PERRLA + right blepharitis, + early stye, she has a eye lash follicle that has pus expressing from it,  fundo exam normal.  Cardiovascular:  Regular rate and rhythm,  no rubs murmurs or gallops.  Radial pulse intact. No pedal edema.  Respiratory: Clear to auscultation bilaterally.  No wheezes, rales, or rhonchi.  No cyanosis, no use of accessory musculature GI: No organomegaly, abdomen is soft and non-tender, positive bowel sounds.  No masses. Skin: No rashes. Neurologic: Facial musculature symmetric. Psychiatric: Patient is appropriate throughout our interaction. Lymphatic: No cervical lymphadenopathy Musculoskeletal: Gait intact.   LABS:    EKG/XRAY:   Primary read interpreted by Dr. Marin Comment at [facility].   ASSESSMENT/PLAN: Encounter Diagnoses  Name Primary?  . Stye, right Yes  . Blepharitis, right    Rx ocuflox Warm compresses F/u prn   Gross sideeffects, risk and benefits, and alternatives of medications d/w patient. Patient is aware that all medications have potential sideeffects and we are unable to predict every sideeffect or drug-drug interaction that may occur.  Marcin Holte, [place], DO 06/27/2014 12:50 PM

## 2014-06-27 NOTE — Patient Instructions (Signed)
Your physician recommends that you continue on your current medications as directed. Please refer to the Current Medication list given to you today. Your physician has requested that you have an echocardiogram. Echocardiography is a painless test that uses sound waves to create images of your heart. It provides your doctor with information about the size and shape of your heart and how well your heart's chambers and valves are working. This procedure takes approximately one hour. There are no restrictions for this procedure. PLEASE SCHEDULE ECHO JUST PRIOR TO YOUR NEXT APPOINTMENT WITH DR ROSS (AUG 2016)  Your physician wants you to follow-up in: AUG/SEPT 2016 [place].  You will receive a reminder letter in the mail two months in advance. If you don't receive a letter, please call our office to schedule the follow-up appointment.

## 2014-06-27 NOTE — Patient Instructions (Signed)

## 2014-08-11 ENCOUNTER — Ambulatory Visit (INDEPENDENT_AMBULATORY_CARE_PROVIDER_SITE_OTHER): Payer: BLUE CROSS/BLUE SHIELD

## 2014-08-11 ENCOUNTER — Ambulatory Visit (INDEPENDENT_AMBULATORY_CARE_PROVIDER_SITE_OTHER): Payer: BLUE CROSS/BLUE SHIELD | Admitting: Family Medicine

## 2014-08-11 VITALS — BP 118/82 | HR 77 | Temp 97.9°F | Resp 18 | Ht 64.5 in | Wt 188.4 lb

## 2014-08-11 DIAGNOSIS — K219 Gastro-esophageal reflux disease without esophagitis: Secondary | ICD-10-CM

## 2014-08-11 DIAGNOSIS — E079 Disorder of thyroid, unspecified: Secondary | ICD-10-CM | POA: Diagnosis not present

## 2014-08-11 DIAGNOSIS — R0789 Other chest pain: Secondary | ICD-10-CM

## 2014-08-11 DIAGNOSIS — R002 Palpitations: Secondary | ICD-10-CM

## 2014-08-11 LAB — COMPLETE METABOLIC PANEL WITH GFR
AST: 18 U/L (ref 0–37)
Albumin: 4.2 g/dL (ref 3.5–5.2)
Alkaline Phosphatase: 85 U/L (ref 39–117)
CO2: 30 mEq/L (ref 19–32)
Chloride: 102 mEq/L (ref 96–112)
Creat: 0.65 mg/dL (ref 0.50–1.10)
GFR, Est Non African American: >89 mL/min
Glucose, Bld: 85 mg/dL (ref 70–99)
Sodium: 141 mEq/L (ref 135–145)
Total Bilirubin: 0.3 mg/dL (ref 0.2–1.2)

## 2014-08-11 LAB — POCT CBC
Granulocyte percent: 68.8 %G (ref 37–80)
HCT, POC: 44.3 % (ref 37.7–47.9)
Hemoglobin: 13.4 g/dL (ref 12.2–16.2)
Lymph, poc: 1.4 (ref 0.6–3.4)
MCH, POC: 26.4 pg — AB (ref 27–31.2)
MCHC: 30.2 g/dL — AB (ref 31.8–35.4)
MCV: 87.5 fL (ref 80–97)
MID (cbc): 0.2 (ref 0–0.9)
MPV: 6.6 fL (ref 0–99.8)
POC Granulocyte: 3.6 (ref 2–6.9)
POC LYMPH PERCENT: 26.9 %L (ref 10–50)
POC MID %: 4.3 % (ref 0–12)
Platelet Count, POC: 218 10*3/uL (ref 142–424)
RBC: 5.06 M/uL (ref 4.04–5.48)
RDW, POC: 17.3 %
WBC: 5.2 10*3/uL (ref 4.6–10.2)

## 2014-08-11 LAB — COMPLETE METABOLIC PANEL WITHOUT GFR
ALT: 26 U/L (ref 0–35)
BUN: 10 mg/dL (ref 6–23)
Calcium: 8.8 mg/dL (ref 8.4–10.5)
GFR, Est African American: >89 mL/min
Potassium: 3.8 meq/L (ref 3.5–5.3)
Total Protein: 6.8 g/dL (ref 6.0–8.3)

## 2014-08-11 LAB — TSH: TSH: 0.835 u[IU]/mL (ref 0.350–4.500)

## 2014-08-11 LAB — T4, FREE: Free T4: 1.14 ng/dL (ref 0.80–1.80)

## 2014-08-11 LAB — T3, FREE: T3, Free: 3 pg/mL (ref 2.3–4.2)

## 2014-08-11 MED ORDER — GI COCKTAIL ~~LOC~~
30.0000 mL | Freq: Once | ORAL | Status: AC
  Administered 2014-08-11: 30 mL via ORAL

## 2014-08-11 NOTE — Patient Instructions (Signed)
Palpitations °A palpitation is the feeling that your heartbeat is irregular or is faster than normal. It may feel like your heart is fluttering or skipping a beat. Palpitations are usually not a serious problem. However, in some cases, you may need further medical evaluation. °CAUSES  °Palpitations can be caused by: °· Smoking. °· Caffeine or other stimulants, such as diet pills or energy drinks. °· Alcohol. °· Stress and anxiety. °· Strenuous physical activity. °· Fatigue. °· Certain medicines. °· Heart disease, especially if you have a history of irregular heart rhythms (arrhythmias), such as atrial fibrillation, atrial flutter, or supraventricular tachycardia. °· An improperly working pacemaker or defibrillator. °DIAGNOSIS  °To find the cause of your palpitations, your health care provider will take your medical history and perform a physical exam. Your health care provider may also have you take a test called an ambulatory electrocardiogram (ECG). An ECG records your heartbeat patterns over a 24-hour period. You may also have other tests, such as: °· Transthoracic echocardiogram (TTE). During echocardiography, sound waves are used to evaluate how blood flows through your heart. °· Transesophageal echocardiogram (TEE). °· Cardiac monitoring. This allows your health care provider to monitor your heart rate and rhythm in real time. °· Holter monitor. This is a portable device that records your heartbeat and can help diagnose heart arrhythmias. It allows your health care provider to track your heart activity for several days, if needed. °· Stress tests by exercise or by giving medicine that makes the heart beat faster. °TREATMENT  °Treatment of palpitations depends on the cause of your symptoms and can vary greatly. Most cases of palpitations do not require any treatment other than time, relaxation, and monitoring your symptoms. Other causes, such as atrial fibrillation, atrial flutter, or supraventricular  tachycardia, usually require further treatment. °HOME CARE INSTRUCTIONS  °· Avoid: °¨ Caffeinated coffee, tea, soft drinks, diet pills, and energy drinks. °¨ Chocolate. °¨ Alcohol. °· Stop smoking if you smoke. °· Reduce your stress and anxiety. Things that can help you relax include: °¨ A method of controlling things in your body, such as your heartbeats, with your mind (biofeedback). °¨ Yoga. °¨ Meditation. °¨ Physical activity such as swimming, jogging, or walking. °· Get plenty of rest and sleep. °SEEK MEDICAL CARE IF:  °· You continue to have a fast or irregular heartbeat beyond 24 hours. °· Your palpitations occur more often. °SEEK IMMEDIATE MEDICAL CARE IF: °· You have chest pain or shortness of breath. °· You have a severe headache. °· You feel dizzy or you faint. °MAKE SURE YOU: °· Understand these instructions. °· Will watch your condition. °· Will get help right away if you are not doing well or get worse. °Document Released: 04/19/2000 Document Revised: 04/27/2013 Document Reviewed: 06/21/2011 °ExitCare® Patient Information ©2015 [employer]. This information is not intended to replace advice given to you by your health care provider. Make sure you discuss any questions you have with your health care provider. ° °

## 2014-08-11 NOTE — Progress Notes (Signed)
Chief Complaint:  Chief Complaint  Patient presents with  . Palpitations    x 2-3 weeks    HPI: Dana [name] is a 34 y.o. female who is here for 2-3 week history of palpitations. She has chest pressure. She has no radiation. She thought it was gas and has taken Prilosec without any relief. She denies any numbness or tingling. She has it throughout the day. In it get stronger as the day progresses. 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 Her thyroid has not been checked in a while. She denies any nausea vomiting, diarrhea, abdominal pain, diaphoresis when this is occurring. She is not confused. There is no premature family history of stroke or heart attack. She does not have risk factors for heart disease.  BP Readings from Last 3 Encounters:  08/11/14 118/82  06/27/14 108/82  06/27/14 110/80      Past Medical History  Diagnosis Date  . Ventral hernia 09/04/2011  . Thyroid disease   . Labile hypertension 06/09/2014   Past Surgical History  Procedure Laterality Date  . Wisdom tooth extraction  2001  . Eye surgery  2006    lasik - both eyes  . Ventral hernia repair  09/13/2011    Procedure: HERNIA REPAIR VENTRAL ADULT;  Surgeon: Imogene Burn. Georgette Dover, MD;  Location: [facility];  Service: General;  Laterality: N/A;  . Hernia repair    . Tubal ligation Bilateral 05/30/2014    Procedure: POST PARTUM TUBAL LIGATION;  Surgeon: Ena Dawley, MD;  Location: [facility];  Service: Gynecology;  Laterality: Bilateral;   History   Social History  . Marital Status: Married    Spouse Name: N/A  . Number of Children: N/A  . Years of Education: N/A   Social History Main Topics  . Smoking status: Never Smoker   . Smokeless tobacco: Never Used  . Alcohol Use: No  . Drug Use: No  . Sexual Activity: Not Currently    Birth Control/ Protection: Surgical   Other Topics Concern  . None   Social History Narrative   Family  History  Problem Relation Age of Onset  . Cancer Maternal Grandmother     lung  . Hypertension Maternal Grandmother   . Hypertension Mother   . Heart disease Maternal Grandfather    No Known Allergies Prior to Admission medications   Medication Sig Start Date End Date Taking? Authorizing Provider  ALPRAZolam Duanne Moron) 0.5 MG tablet Take 1 tablet (0.5 mg total) by mouth 2 (two) times daily as needed for anxiety. 06/11/14  Yes Donnel Saxon, CNM  Prenatal Vit-Fe Fumarate-FA (PRENATAL MULTIVITAMIN) TABS tablet Take 1 tablet by mouth daily.    Yes Historical Provider, MD     ROS: The patient denies fevers, chills, night sweats, unintentional weight loss, chest pain,  wheezing, dyspnea on exertion, nausea, vomiting, abdominal pain, dysuria, hematuria, melena, numbness, weakness, or tingling.  All other systems have been reviewed and were otherwise negative with the exception of those mentioned in the HPI and as above.    PHYSICAL EXAM: Filed Vitals:   08/11/14 1217  BP: 118/82  Pulse: 77  Temp: 97.9 F (36.6 C)  Resp: 18   Filed Vitals:   08/11/14 1217  Height: 5' 4.5" (1.638 m)  Weight: 188 lb 6.4 oz (85.458 kg)   Body mass index is 31.85 kg/(m^2).  General: Alert, no acute distress HEENT:  Normocephalic, atraumatic, oropharynx  patent. EOMI, PERRLA. Endoscopic exam normal, TM normal Cardiovascular:  Regular rate and rhythm, no rubs murmurs or gallops.  No Carotid bruits, radial pulse intact. No pedal edema.  Respiratory: Clear to auscultation bilaterally.  No wheezes, rales, or rhonchi.  No cyanosis, no use of accessory musculature GI: No organomegaly, abdomen is soft and non-tender, positive bowel sounds.  No masses. Skin: No rashes. Neurologic: Facial musculature symmetric. Cranial nerves II-12 grossly normal Romberg negative, 5 out of 5 strength, 2 out of 2 DTRs. Psychiatric: Patient is appropriate throughout our interaction. Lymphatic: No cervical  lymphadenopathy Musculoskeletal: Gait intact.   LABS: Results for orders placed or performed in visit on 08/11/14  POCT CBC  Result Value Ref Range   WBC 5.2 4.6 - 10.2 K/uL   Lymph, poc 1.4 0.6 - 3.4   POC LYMPH PERCENT 26.9 10 - 50 %L   MID (cbc) 0.2 0 - 0.9   POC MID % 4.3 0 - 12 %M   POC Granulocyte 3.6 2 - 6.9   Granulocyte percent 68.8 37 - 80 %G   RBC 5.06 4.04 - 5.48 M/uL   Hemoglobin 13.4 12.2 - 16.2 g/dL   HCT, POC 44.3 37.7 - 47.9 %   MCV 87.5 80 - 97 fL   MCH, POC 26.4 (A) 27 - 31.2 pg   MCHC 30.2 (A) 31.8 - 35.4 g/dL   RDW, POC 17.3 %   Platelet Count, POC 218 142 - 424 K/uL   MPV 6.6 0 - 99.8 fL     EKG/XRAY:   Primary read interpreted by [facility]. Marin Comment at [facility]. No acute cardiopulmonary process   ASSESSMENT/PLAN: Encounter Diagnoses  Name Primary?  . Chest pressure Yes  . Gastroesophageal reflux disease, esophagitis presence not specified   . Palpitations   . Thyroid disease    Pleasant 34 year old African-American female with no significant past medical history for any heart disease. She does have a remote history of hypothyroidism after pregnancy and was treated with levothyroxine for 1 year but has been off of it for sometime. She has had 2-3 week history of palpitations which a been intermittent but worsened throughout the day. EKG and chest x-ray are both reassuring. I've given her a GI cocktail and she has a remote history of GERD as well. Labs pending If all labs are normal then we will can refer to cardiology if symptoms continue  Gross sideeffects, risk and benefits, and alternatives of medications d/w patient. Patient is aware that all medications have potential sideeffects and we are unable to predict every sideeffect or drug-drug interaction that may occur.  LE, [place], DO 08/11/2014 2:15 PM

## 2014-08-12 NOTE — Addendum Note (Signed)
Addended by: Constance Goltz on: 08/12/2014 04:15 PM   Modules accepted: Orders

## 2014-08-15 ENCOUNTER — Telehealth: Payer: Self-pay | Admitting: Internal Medicine

## 2014-08-15 NOTE — Telephone Encounter (Signed)
New Message        Pt calling stating she has chest discomfort (not chest pain), feels like pressure on her chest, has been occuring for the past 3 weeks, seen in Urgent Care 08/11/14 and EKG and labs were normal, told to f/u w/ Dr. Harrington Challenger, describes as continuous but discomfort does get lighter at times. Please call back and advise.

## 2014-08-15 NOTE — Telephone Encounter (Signed)
Set patient up for event monitor then appt with me after

## 2014-08-15 NOTE — Telephone Encounter (Signed)
Patient c/o chest pressure, always present, worse at times than other times, and palpitations. PCP saw her last week.  Had labs, ekg and cxr.  Told all was normal and f/u with Dr. Harrington Challenger.  Pt had GI cocktail at PCP office, helped but symptoms returned later same day.  Taking prilosec daily for last 3 weeks. Palpitations cause her to lose her breath at times.  She is aware I am forwarding to Dr. Harrington Challenger and will her back with any recommendations.

## 2014-08-17 NOTE — Telephone Encounter (Signed)
Called patient. Feeling better.  Wonders if prilosec has started to work. Advised about event monitor. Since today was good she would like to wait a week or so to see if it returns.  Advised her to call back if she decides. Verbalizes understanding and agreement.

## 2014-09-12 ENCOUNTER — Telehealth: Payer: Self-pay | Admitting: Internal Medicine

## 2014-09-12 ENCOUNTER — Encounter: Payer: Self-pay | Admitting: Internal Medicine

## 2014-09-12 ENCOUNTER — Ambulatory Visit (INDEPENDENT_AMBULATORY_CARE_PROVIDER_SITE_OTHER): Payer: BLUE CROSS/BLUE SHIELD | Admitting: Internal Medicine

## 2014-09-12 VITALS — BP 124/80 | HR 77 | Ht 65.0 in | Wt 192.8 lb

## 2014-09-12 DIAGNOSIS — I10 Essential (primary) hypertension: Secondary | ICD-10-CM

## 2014-09-12 DIAGNOSIS — R0789 Other chest pain: Secondary | ICD-10-CM

## 2014-09-12 MED ORDER — LOSARTAN POTASSIUM 50 MG PO TABS: 50.0000 mg | ORAL_TABLET | Freq: Every day | ORAL | Status: DC

## 2014-09-12 MED ORDER — HYDROCHLOROTHIAZIDE 12.5 MG PO CAPS: 12.5000 mg | ORAL_CAPSULE | Freq: Every day | ORAL | Status: DC

## 2014-09-12 NOTE — Telephone Encounter (Signed)
Discussed with Dr. Harrington Challenger. Left detailed message on self identified voice mail to call back and schedule with Dr. Harrington Challenger today at 3:45 pm. This is a hold spot--okay to use for this patient. Asked that she call back today by noon to schedule.

## 2014-09-12 NOTE — Patient Instructions (Signed)
Your physician has recommended you make the following change in your medication:  1.) losartan (Cozaar) 50 mg once daily 2.) HCTZ 12.5 mg daily  Your physician recommends that you return for lab work in: today (CBC, BNP, BMET)  Your physician recommends that you schedule a follow-up appointment in: 3-4 [place].

## 2014-09-12 NOTE — Progress Notes (Signed)
Cardiology Office Note   Date:  09/12/2014   ID:  SNEHA WILLIG, DOB [birth year], MRN [id number]  PCP:  Casilda Carls, MD  Cardiologist:   Dorris Carnes, MD   No chief complaint on file.     History of Present Illness: Dana [name] is a 34 y.o. female with a history of Hypertension.  I saw her in Feb  She delivered a baby in January  Echo at time showed LVEF normal  Mild to mod mR   Patient called in today  COmplained of chest pressure on and off x 2 wks  COnstant for 24  With activity pressure builds   Feels when BP is up  hours  No SOB  BP was elevated yestresay 156/103.  WIth meds came down to 131/89   No chest pressure is there but not as bad asyesterday  No vomiting  Bowels OK  No blood in stool No cough   Had phlegm  Clear  Hx cold     Current Outpatient Prescriptions  Medication Sig Dispense Refill  . ALPRAZolam (XANAX) 0.5 MG tablet Take 1 tablet (0.5 mg total) by mouth 2 (two) times daily as needed for anxiety. 36 tablet 0  . hydrochlorothiazide (MICROZIDE) 12.5 MG capsule Take 12.5 mg by mouth daily.  1  . Lansoprazole (PREVACID 24HR PO) Take 1 capsule by mouth daily. Pt. Not sure of actual dose    . losartan (COZAAR) 100 MG tablet Take 100 mg by mouth daily.  2   No current facility-administered medications for this visit.    Allergies:   Review of patient's allergies indicates no known allergies.   Past Medical History  Diagnosis Date  . Ventral hernia 09/04/2011  . Thyroid disease   . Labile hypertension 06/09/2014    Past Surgical History  Procedure Laterality Date  . Wisdom tooth extraction  2001  . Eye surgery  2006    lasik - both eyes  . Ventral hernia repair  09/13/2011    Procedure: HERNIA REPAIR VENTRAL ADULT;  Surgeon: Imogene Burn. Georgette Dover, MD;  Location: [facility];  Service: General;  Laterality: N/A;  . Hernia repair    . Tubal ligation Bilateral 05/30/2014    Procedure: POST PARTUM TUBAL LIGATION;  Surgeon: Ena Dawley, MD;   Location: [facility];  Service: Gynecology;  Laterality: Bilateral;     Social History:  The patient  reports that she has never smoked. She has never used smokeless tobacco. She reports that she does not drink alcohol or use illicit drugs.   Family History:  The patient's family history includes Cancer in her maternal grandmother; Heart disease in her maternal grandfather; Hypertension in her maternal grandmother and mother.    ROS:  Please see the history of present illness. All other systems are reviewed and  Negative to the above problem except as noted.    PHYSICAL EXAM: VS:  BP 124/80 mmHg  Pulse 77  Ht 5\' 5"  (1.651 m)  Wt 192 lb 12.8 oz (87.454 kg)  BMI 32.08 kg/m2  GEN: Well nourished, well developed, in no acute distress HEENT: normal Neck: no JVD, carotid bruits, or masses Cardiac: RRR; no murmurs, rubs, or gallops,no edema  Respiratory:  clear to auscultation bilaterally, normal work of breathing GI: soft, nontender, nondistended, + BS  No hepatomegaly  MS: no deformity Moving all extremities   Skin: warm and dry, no rash Neuro:  Strength and sensation are intact Psych: euthymic mood, full affect   EKG:  EKG is ordered today.  SR 78 bpm     Lipid Panel No results found for: CHOL, TRIG, HDL, CHOLHDL, VLDL, LDLCALC, LDLDIRECT    Wt Readings from Last 3 Encounters:  09/12/14 192 lb 12.8 oz (87.454 kg)  08/11/14 188 lb 6.4 oz (85.458 kg)  06/27/14 188 lb 3.2 oz (85.367 kg)      ASSESSMENT AND PLAN:  1.  HTN  BP is good now.  She has not been taking meds    Regularly  I think she should be on Cozaar 50 and HCTZ 12.5 mg    2.  Chest pressure  EKG is neg  Discomfort is atypical for cardiac ischemia  Poss GI in orign  May be related to BP  Would follow   Dorris Carnes, MD  09/12/2014 3:54 PM    [address], [place], [place]  [address] Phone: [phone]; Fax: [phone]

## 2014-09-12 NOTE — Telephone Encounter (Signed)
Pt c/o of Chest Pressure: 1. Are you having CP right now? Yes  2. Are you experiencing any other symptoms (ex. SOB, nausea, vomiting, sweating)? No  3. How long have you been experiencing CP? Within the last 24 hours and More intense within the last 5 hours  4. Is your CP continuous or coming and going? Continuous  5. Have you taken Nitroglycerin? No. Taken Losartan and Hydrochlorthiazide.   Pt states that he BP was elevated and would not go down. Chest pressure for the last 24 hours.

## 2014-09-12 NOTE — Telephone Encounter (Signed)
Call was a direct transfer. C/o of chest pressure off and on x 2 weeks but is more consist ant for 24 hours. Denies SOB, no palpitations, not diaphoretic.  Prilosec is not helping. BP was elevated yesterday highest was 156/103 and took cozaar and HCTZ, pressure came down to 131/89. Pt was on bp meds for pre eclampsia and was used inconsistently but used it yesterday since it was so high Highest pressure has been 156/103 to lowest 115/84 pulse range 60-70. States the pressure is mid chest above the nipple line. I will consult with Dr Dionicio Stall RN

## 2014-09-13 LAB — CBC
HCT: 41 % (ref 36.0–46.0)
Hemoglobin: 13.6 g/dL (ref 12.0–15.0)
MCHC: 33.2 g/dL (ref 30.0–36.0)
MCV: 86.3 fl (ref 78.0–100.0)
Platelets: 212 10*3/uL (ref 150.0–400.0)
RBC: 4.75 Mil/uL (ref 3.87–5.11)
RDW: 15 % (ref 11.5–15.5)
WBC: 6.3 10*3/uL (ref 4.0–10.5)

## 2014-09-13 LAB — BASIC METABOLIC PANEL
BUN: 12 mg/dL (ref 6–23)
CALCIUM: 8.8 mg/dL (ref 8.4–10.5)
CO2: 31 mEq/L (ref 19–32)
CREATININE: 0.74 mg/dL (ref 0.40–1.20)
Chloride: 101 mEq/L (ref 96–112)
GFR: 114.36 mL/min (ref >60.00)
Glucose, Bld: 79 mg/dL (ref 70–99)
Potassium: 3.6 mEq/L (ref 3.5–5.1)
Sodium: 138 mEq/L (ref 135–145)

## 2014-09-13 LAB — BRAIN NATRIURETIC PEPTIDE: PRO B NATRI PEPTIDE: 20 pg/mL (ref 0.0–100.0)

## 2014-09-21 ENCOUNTER — Telehealth: Payer: Self-pay | Admitting: Internal Medicine

## 2014-09-21 DIAGNOSIS — R002 Palpitations: Secondary | ICD-10-CM | POA: Insufficient documentation

## 2014-09-21 NOTE — Telephone Encounter (Signed)
Patient is having a recurrence of continuous intermittent palpitations (HR irregular 70-80's bpm) since awakening this morning after five weeks of being "okay" (s/p January childbirth). Patient's last occurrence of palpitations was on 4/11. She also checked her BP (90/60) this morning and proceeded to drink a few extra glasses of water. She rechecked her BP this afternoon (110/80). Patient's last occurrence of elevated BP and chest pressure was on 5/9.  At that time, Dr. Harrington Challenger recommended a Holter Monitor, however the patient wanted to defer a little longer before further testing. Patient is now experiencing problems again and states she is willing to have a Holter Monitor at this time. Patient denies: dizziness, SOB, swelling, CP, new or change in stress or medications or OTC medications/supplements, significant caffeine use, or dehydration.  Dr. [place] Pax reviewed information and previous notes from Dr. Harrington Challenger. Patient notified that Dr. [place] Pax advises her to HOLD HCTZ until further notice, continue taking Losartan as ordered and placement of 24 Hour Holter Monitor as soon as possible. Patient verbalized understanding and agreement. Nurse provided patient with information regarding after hours problems or concerns if sx worsen or if she experiences new sx (especially CP/pressure or SOB or dizziness). Patient able to verbalize how to contact On Call MD with [facility]. Information will be routed to Dr. Harrington Challenger for f/u.

## 2014-09-21 NOTE — Telephone Encounter (Signed)
New Message      Pt calling stating she is having heart flutters which started this morning and her BP was down to 97/73 and her HR was 87, have been off and on all day. Please call back and advise.

## 2014-09-22 ENCOUNTER — Telehealth: Payer: Self-pay | Admitting: Internal Medicine

## 2014-09-22 DIAGNOSIS — R002 Palpitations: Secondary | ICD-10-CM

## 2014-09-22 NOTE — Telephone Encounter (Signed)
Monitor has been ordered. Will send message for [facility] to schedule. Called patient to let her know that someone would be calling in the next couple of days to schedule the monitor. Patient verbalized understanding.

## 2014-09-22 NOTE — Telephone Encounter (Signed)
Rec'd clarification from Dr. Harrington Challenger that if patient having occasional intermittent palpitations then Event Monitor is best option. If patient having palpitations daily, holter monitor is appropriate. At this time, will keep 24 Hour Holter monitor order unless patient indicated palpitations have subsided.

## 2014-09-22 NOTE — Telephone Encounter (Signed)
Agree with holding HCTZ Would set up for event monitor if she is having intermitt palpitations.

## 2014-09-22 NOTE — Telephone Encounter (Signed)
Rec'd

## 2014-09-22 NOTE — Telephone Encounter (Signed)
Follow Up      Pt calling to schedule monitor but there is no order. Please call back and advise.

## 2014-09-23 ENCOUNTER — Other Ambulatory Visit: Payer: Self-pay | Admitting: *Deleted

## 2014-09-23 DIAGNOSIS — R002 Palpitations: Secondary | ICD-10-CM

## 2014-09-23 NOTE — Telephone Encounter (Signed)
Schedule for 09-28-14 patient is aware. Dana [name]

## 2014-09-28 ENCOUNTER — Ambulatory Visit (INDEPENDENT_AMBULATORY_CARE_PROVIDER_SITE_OTHER): Payer: BLUE CROSS/BLUE SHIELD

## 2014-09-28 DIAGNOSIS — R002 Palpitations: Secondary | ICD-10-CM | POA: Diagnosis not present

## 2014-10-06 ENCOUNTER — Ambulatory Visit: Payer: BLUE CROSS/BLUE SHIELD | Admitting: Internal Medicine

## 2014-10-31 ENCOUNTER — Ambulatory Visit: Payer: BLUE CROSS/BLUE SHIELD | Admitting: Internal Medicine

## 2014-11-10 ENCOUNTER — Ambulatory Visit (INDEPENDENT_AMBULATORY_CARE_PROVIDER_SITE_OTHER): Payer: BLUE CROSS/BLUE SHIELD | Admitting: Internal Medicine

## 2014-11-10 ENCOUNTER — Encounter: Payer: Self-pay | Admitting: Internal Medicine

## 2014-11-10 VITALS — BP 116/70 | HR 87 | Ht 65.0 in | Wt 191.8 lb

## 2014-11-10 DIAGNOSIS — I10 Essential (primary) hypertension: Secondary | ICD-10-CM | POA: Diagnosis not present

## 2014-11-10 DIAGNOSIS — R0989 Other specified symptoms and signs involving the circulatory and respiratory systems: Secondary | ICD-10-CM

## 2014-11-10 LAB — BASIC METABOLIC PANEL
BUN: 11 mg/dL (ref 6–23)
CO2: 29 mEq/L (ref 19–32)
Calcium: 9.3 mg/dL (ref 8.4–10.5)
Chloride: 103 mEq/L (ref 96–112)
Creatinine, Ser: 0.75 mg/dL (ref 0.40–1.20)
GFR: 112.5 mL/min (ref >60.00)
Glucose, Bld: 93 mg/dL (ref 70–99)
POTASSIUM: 3.7 meq/L (ref 3.5–5.1)
SODIUM: 140 meq/L (ref 135–145)

## 2014-11-10 NOTE — Progress Notes (Signed)
Cardiology Office Note   Date:  11/10/2014   ID:  Dana [name], DOB 04-27-1981, MRN [id number]  PCP:  Casilda Carls, MD  Cardiologist:   Dorris Carnes, MD   No chief complaint on file.  Pt returns for evaluation of HTN     History of Present Illness: Dana [name] is a 34 y.o. female with a history  Hypertension and chest pressure  Echo with normal LVEF and mild to mod MR. When I saw her last I recomm that she restart her antihypertensives She continues to take losartan  She felt palpitations on HCTZ  She denies CP  Breathing is OK      Current Outpatient Prescriptions  Medication Sig Dispense Refill  . ALPRAZolam (XANAX) 0.5 MG tablet Take 1 tablet (0.5 mg total) by mouth 2 (two) times daily as needed for anxiety. 36 tablet 0  . Lansoprazole (PREVACID 24HR PO) Take 1 capsule by mouth daily. Pt. Not sure of actual dose    . losartan (COZAAR) 50 MG tablet Take 1 tablet (50 mg total) by mouth daily. 90 tablet 3   No current facility-administered medications for this visit.    Allergies:   Review of patient's allergies indicates no known allergies.   Past Medical History  Diagnosis Date  . Ventral hernia 09/04/2011  . Thyroid disease   . Labile hypertension 06/09/2014    Past Surgical History  Procedure Laterality Date  . Wisdom tooth extraction  2001  . Eye surgery  2006    lasik - both eyes  . Ventral hernia repair  09/13/2011    Procedure: HERNIA REPAIR VENTRAL ADULT;  Surgeon: Imogene Burn. Georgette Dover, MD;  Location: [facility];  Service: General;  Laterality: N/A;  . Hernia repair    . Tubal ligation Bilateral 05/30/2014    Procedure: POST PARTUM TUBAL LIGATION;  Surgeon: Ena Dawley, MD;  Location: [facility];  Service: Gynecology;  Laterality: Bilateral;     Social History:  The patient  reports that she has never smoked. She has never used smokeless tobacco. She reports that she does not drink alcohol or use illicit drugs.   Family History:  The  patient's family history includes Cancer in her maternal grandmother; Heart disease in her maternal grandfather; Hypertension in her maternal grandmother and mother.    ROS:  Please see the history of present illness. All other systems are reviewed and  Negative to the above problem except as noted.    PHYSICAL EXAM: VS:  BP 116/70 mmHg  Pulse 87  Ht 5\' 5"  (1.651 m)  Wt 191 lb 12.8 oz (87 kg)  BMI 31.92 kg/m2  GEN: Well nourished, well developed, in no acute distress HEENT: normal Neck: no JVD, carotid bruits, or masses Cardiac: RRR; no murmurs, rubs, or gallops,no edema  Respiratory:  clear to auscultation bilaterally, normal work of breathing GI: soft, nontender, nondistended, + BS  No hepatomegaly  MS: no deformity Moving all extremities   Skin: warm and dry, no rash Neuro:  Strength and sensation are intact Psych: euthymic mood, full affect   EKG:  EKG is not  ordered today.   Lipid Panel No results found for: CHOL, TRIG, HDL, CHOLHDL, VLDL, LDLCALC, LDLDIRECT    Wt Readings from Last 3 Encounters:  11/10/14 191 lb 12.8 oz (87 kg)  09/12/14 192 lb 12.8 oz (87.454 kg)  08/11/14 188 lb 6.4 oz (85.458 kg)      ASSESSMENT AND PLAN:  1.  HTN  BP  is good  I would keep on losartan Check BMET today  2.  Chest pressure  Denies  3.  HCM  Encouraged her to stay acitve, work on wt loss   F/U next year.   Signed, Dorris Carnes, MD  11/10/2014 9:06 AM    [address], [place], [place]  [address] Phone: [phone]; Fax: [phone]

## 2014-11-10 NOTE — Patient Instructions (Signed)
Your physician recommends that you continue on your current medications as directed. Please refer to the Current Medication list given to you today. Your physician recommends that you return for lab work TODAY (BMET)  Your physician wants you to follow-up in: MARCH 2017 WITH DR. Harrington Challenger.  You will receive a reminder letter in the mail two months in advance. If you don't receive a letter, please call our office to schedule the follow-up appointment.

## 2014-12-27 ENCOUNTER — Encounter (HOSPITAL_COMMUNITY): Payer: Self-pay | Admitting: *Deleted

## 2014-12-27 ENCOUNTER — Emergency Department (HOSPITAL_COMMUNITY): Payer: BLUE CROSS/BLUE SHIELD

## 2014-12-27 ENCOUNTER — Telehealth: Payer: Self-pay | Admitting: Internal Medicine

## 2014-12-27 ENCOUNTER — Emergency Department (HOSPITAL_COMMUNITY)
Admission: EM | Admit: 2014-12-27 | Discharge: 2014-12-27 | Disposition: A | Discharge status: Home / Self Care | Payer: BLUE CROSS/BLUE SHIELD | Attending: Emergency Medicine | Admitting: Emergency Medicine

## 2014-12-27 DIAGNOSIS — K219 Gastro-esophageal reflux disease without esophagitis: Secondary | ICD-10-CM | POA: Diagnosis not present

## 2014-12-27 DIAGNOSIS — Z79899 Other long term (current) drug therapy: Secondary | ICD-10-CM | POA: Insufficient documentation

## 2014-12-27 DIAGNOSIS — R079 Chest pain, unspecified: Secondary | ICD-10-CM | POA: Diagnosis present

## 2014-12-27 DIAGNOSIS — Z8639 Personal history of other endocrine, nutritional and metabolic disease: Secondary | ICD-10-CM | POA: Diagnosis not present

## 2014-12-27 LAB — BASIC METABOLIC PANEL
Anion gap: 4 — ABNORMAL LOW (ref 5–15)
BUN: 6 mg/dL (ref 6–20)
CALCIUM: 9.1 mg/dL (ref 8.9–10.3)
CO2: 31 mmol/L (ref 22–32)
Chloride: 104 mmol/L (ref 101–111)
Creatinine, Ser: 0.68 mg/dL (ref 0.44–1.00)
GFR calc Af Amer: >60 mL/min (ref >60)
GLUCOSE: 94 mg/dL (ref 65–99)
POTASSIUM: 4 mmol/L (ref 3.5–5.1)
SODIUM: 139 mmol/L (ref 135–145)

## 2014-12-27 LAB — CBC
HEMATOCRIT: 42.9 % (ref 36.0–46.0)
Hemoglobin: 13.9 g/dL (ref 12.0–15.0)
MCH: 28.8 pg (ref 26.0–34.0)
MCHC: 32.4 g/dL (ref 30.0–36.0)
MCV: 89 fL (ref 78.0–100.0)
PLATELETS: 178 10*3/uL (ref 150–400)
RBC: 4.82 MIL/uL (ref 3.87–5.11)
RDW: 13 % (ref 11.5–15.5)
WBC: 4.6 10*3/uL (ref 4.0–10.5)

## 2014-12-27 LAB — I-STAT TROPONIN, ED: TROPONIN I, POC: 0 ng/mL (ref 0.00–0.08)

## 2014-12-27 MED ORDER — PANTOPRAZOLE SODIUM 20 MG PO TBEC: 20.0000 mg | DELAYED_RELEASE_TABLET | Freq: Every day | ORAL | Status: DC

## 2014-12-27 NOTE — Telephone Encounter (Signed)
Pt states that she has been having CP for the last 2-3 weeks. Pt states that it comes and goes and worsens throughout the day when she is exerting. Pt states that pain is in midchest- radiates slightly to left but more to right and goes to her neck and back. Pt states that when she lays down at night the pain improves enough that she can fall asleep. Pt states that occasionally she has SOB, lightheadedness and dizziness with her CP. Pt denies nausea, vomitting or sweating. BP today 113/75, HR 80. Pt seen in the past for chest pressure and pt states that this pain is worse than the chest pressure she has had in the past. Spoke with Tarri Fuller, PA-C and he said to have pt go to ED for evaluation. Spoke with pt and informed her of the recommendation per Tarri Fuller. Pt verbalized understanding and was in agreement with this plan.

## 2014-12-27 NOTE — ED Notes (Signed)
Pt states that she has had central chest pain that radiated to the left side and inter her neck. Pt states that her cardiologist told her to come in. Pt states that "i burped in the car and the pain went away". Pt denies pain at this time.

## 2014-12-27 NOTE — ED Provider Notes (Signed)
CSN: [id number]     Arrival date & time 12/27/14  1040 History   First MD Initiated Contact with Patient 12/27/14 1316     Chief Complaint  Patient presents with  . Chest Pain   HPI  34yo female presenting today with chest pain. Had substernal chest pain without radiation. Denies diaphoresis. States pain happens 1-2 times a day for the past month or so and will last approximately thirty minutes. Unsure if related to meals. Follows with Cardiology for history of pre-eclampsia with unresolved hypertension. States she has had extensive workup including Echocardiogram. Was told to come to ED for evaluation by Cardiologist. She then burped in the car on the way over and the pain went away. Denies pain at this time.  Past Medical History  Diagnosis Date  . Ventral hernia 09/04/2011  . Thyroid disease   . Labile hypertension 06/09/2014   Past Surgical History  Procedure Laterality Date  . Wisdom tooth extraction  2001  . Eye surgery  2006    lasik - both eyes  . Ventral hernia repair  09/13/2011    Procedure: HERNIA REPAIR VENTRAL ADULT;  Surgeon: Imogene Burn. Georgette Dover, MD;  Location: [facility];  Service: General;  Laterality: N/A;  . Hernia repair    . Tubal ligation Bilateral 05/30/2014    Procedure: POST PARTUM TUBAL LIGATION;  Surgeon: Ena Dawley, MD;  Location: [facility];  Service: Gynecology;  Laterality: Bilateral;   Family History  Problem Relation Age of Onset  . Cancer Maternal Grandmother     lung  . Hypertension Maternal Grandmother   . Hypertension Mother   . Heart disease Maternal Grandfather    Social History  Substance Use Topics  . Smoking status: Never Smoker   . Smokeless tobacco: Never Used  . Alcohol Use: No   OB History    Gravida Para Term Preterm AB TAB SAB Ectopic Multiple Living   2 2 2       0 1     Review of Systems  Cardiovascular: Positive for chest pain.      Allergies  Review of patient's allergies indicates no known  allergies.  Home Medications   Prior to Admission medications   Medication Sig Start Date End Date Taking? Authorizing Provider  ALPRAZolam Duanne Moron) 0.5 MG tablet Take 1 tablet (0.5 mg total) by mouth 2 (two) times daily as needed for anxiety. 06/11/14   Donnel Saxon, CNM  Lansoprazole (PREVACID 24HR PO) Take 1 capsule by mouth daily. Pt. Not sure of actual dose    Historical Provider, MD  losartan (COZAAR) 50 MG tablet Take 1 tablet (50 mg total) by mouth daily. 09/12/14   Fay Records, MD   BP 124/78 mmHg  Pulse 84  Temp(Src) 97.9 F (36.6 C) (Oral)  Resp 18  Ht 5\' 5"  (1.651 m)  Wt 193 lb (87.544 kg)  BMI 32.12 kg/m2  SpO2 100%  LMP 12/21/2014 Physical Exam  Constitutional: She is oriented to person, place, and time. She appears well-developed and well-nourished. No distress.  Cardiovascular: Normal rate and regular rhythm.  Exam reveals no gallop and no friction rub.   No murmur heard. Pulmonary/Chest: Effort normal and breath sounds normal. No respiratory distress. She has no wheezes. She has no rales. She exhibits no tenderness.  Abdominal: Soft. Bowel sounds are normal. She exhibits no distension. There is no tenderness.  Musculoskeletal: She exhibits no edema.  Neurological: She is alert and oriented to person, place, and time.  Skin:  No rash noted.  Psychiatric: She has a normal mood and affect. Her behavior is normal.    ED Course  Procedures (including critical care time) Labs Review Labs Reviewed  BASIC METABOLIC PANEL - Abnormal; Notable for the following:    Anion gap 4 (*)    All other components within normal limits  CBC  I-STAT TROPOININ, ED    Imaging Review Dg Chest 2 View  12/27/2014   CLINICAL DATA:  Mid chest discomfort and pressure for 2 weeks.  EXAM: CHEST  2 VIEW  COMPARISON:  08/11/2014  FINDINGS: The heart size and mediastinal contours are within normal limits. Both lungs are clear. The visualized skeletal structures are unremarkable.  IMPRESSION: No  active cardiopulmonary disease.   Electronically Signed   By: Markus Daft M.D.   On: 12/27/2014 11:46   I have personally reviewed and evaluated these images and lab results as part of my medical decision-making.   EKG Interpretation   Date/Time:  Tuesday December 27 2014 10:58:00 EDT Ventricular Rate:  81 PR Interval:  146 QRS Duration: 86 QT Interval:  372 QTC Calculation: 432 R Axis:   48 Text Interpretation:  Normal sinus rhythm Normal ECG No significant change  since last tracing Confirmed by FLOYD MD, DANIEL ([id number]) on 12/27/2014  1:35:37 PM      MDM   Final diagnoses:  None  EKG normal. BMP normal. CBC normal. Troponin negative. Suspect pain is due to GERD. Stable for discharge with Protonix. Recommend keeping diary concerning when and what she eats in relation to pain. Follow up with PCP with diary.    [place], [place] 12/27/14 [place], DO 12/27/14 1553

## 2014-12-27 NOTE — Telephone Encounter (Signed)
New message    Pt c/o of Chest Pain: STAT if CP now or developed within 24 hours  1. Are you having CP right now? Yes  2. Are you experiencing any other symptoms (ex. SOB, nausea, vomiting, sweating)? Has some SOB that comes and goes  3. How long have you been experiencing CP? 2 weeks  4. Is your CP continuous or coming and going? Coming and going  5. Have you taken Nitroglycerin? No ? Pain in mid chest that radiates to left up into neck and back When pt lays down she doesn't notice as much but when pt gets up and gets day started it comes back

## 2014-12-27 NOTE — Discharge Instructions (Signed)
Gastroesophageal Reflux Disease, Adult Gastroesophageal reflux disease (GERD) happens when acid from your stomach flows up into the esophagus. When acid comes in contact with the esophagus, the acid causes soreness (inflammation) in the esophagus. Over time, GERD may create small holes (ulcers) in the lining of the esophagus. CAUSES   Increased body weight. This puts pressure on the stomach, making acid rise from the stomach into the esophagus.  Smoking. This increases acid production in the stomach.  Drinking alcohol. This causes decreased pressure in the lower esophageal sphincter (valve or ring of muscle between the esophagus and stomach), allowing acid from the stomach into the esophagus.  Late evening meals and a full stomach. This increases pressure and acid production in the stomach.  A malformed lower esophageal sphincter. Sometimes, no cause is found. SYMPTOMS   Burning pain in the lower part of the mid-chest behind the breastbone and in the mid-stomach area. This may occur twice a week or more often.  Trouble swallowing.  Sore throat.  Dry cough.  Asthma-like symptoms including chest tightness, shortness of breath, or wheezing. DIAGNOSIS  Your caregiver may be able to diagnose GERD based on your symptoms. In some cases, X-rays and other tests may be done to check for complications or to check the condition of your stomach and esophagus. TREATMENT  Your caregiver may recommend over-the-counter or prescription medicines to help decrease acid production. Ask your caregiver before starting or adding any new medicines.  HOME CARE INSTRUCTIONS   Change the factors that you can control. Ask your caregiver for guidance concerning weight loss, quitting smoking, and alcohol consumption.  Avoid foods and drinks that make your symptoms worse, such as:  Caffeine or alcoholic drinks.  Chocolate.  Peppermint or mint flavorings.  Garlic and onions.  Spicy foods.  Citrus fruits,  such as oranges, lemons, or limes.  Tomato-based foods such as sauce, chili, salsa, and pizza.  Fried and fatty foods.  Avoid lying down for the 3 hours prior to your bedtime or prior to taking a nap.  Eat small, frequent meals instead of large meals.  Wear loose-fitting clothing. Do not wear anything tight around your waist that causes pressure on your stomach.  Raise the head of your bed 6 to 8 inches with wood blocks to help you sleep. Extra pillows will not help.  Only take over-the-counter or prescription medicines for pain, discomfort, or fever as directed by your caregiver.  Do not take aspirin, ibuprofen, or other nonsteroidal anti-inflammatory drugs (NSAIDs). SEEK IMMEDIATE MEDICAL CARE IF:   You have pain in your arms, neck, jaw, teeth, or back.  Your pain increases or changes in intensity or duration.  You develop nausea, vomiting, or sweating (diaphoresis).  You develop shortness of breath, or you faint.  Your vomit is green, yellow, black, or looks like coffee grounds or blood.  Your stool is red, bloody, or black. These symptoms could be signs of other problems, such as heart disease, gastric bleeding, or esophageal bleeding. MAKE SURE YOU:   Understand these instructions.  Will watch your condition.  Will get help right away if you are not doing well or get worse. Document Released: 01/30/2005 Document Revised: 07/15/2011 Document Reviewed: 11/09/2010 ExitCare Patient Information 2015 [employer]. This information is not intended to replace advice given to you by your health care provider. Make sure you discuss any questions you have with your health care provider.  

## 2015-01-23 ENCOUNTER — Other Ambulatory Visit: Payer: Self-pay | Admitting: Family Medicine

## 2016-07-19 ENCOUNTER — Ambulatory Visit: Payer: Self-pay | Admitting: Surgery

## 2016-07-19 NOTE — H&P (Signed)
History of Present Illness Dana [name]. Dana Rekowski MD; 07/15/2016 11:56 AM) Patient words: recheck hernia.  The patient is a 36 year old female who presents with an umbilical hernia. This patient is status post open repair of a 1.5 cm supraumbilical ventral hernia on 09/13/11. We repaired this with a small proceed ventral patch. The patient became pregnant and has a 16-year-old son. Since delivering her child, she has noticed some bulging above her previous incision. She gets some discomfort in this area. The mass has not enlarged but is becoming more frequently uncomfortable. She denies any GI obstructive symptoms. She does report feeling that she gets full faster when she eats. No imaging of this area.   Past Surgical History Yehuda Mao, RMA; 07/15/2016 9:49 AM) Oral Surgery  Ventral / Umbilical Hernia Surgery  Bilateral.  Diagnostic Studies History Yehuda Mao, RMA; 07/15/2016 9:49 AM) Colonoscopy  never Mammogram  never Pap Smear  1-5 years ago  Allergies Shirlean Mylar Gwynn, RMA; 07/15/2016 9:50 AM) No Known Drug Allergies 07/15/2016  Medication History Shirlean Mylar Gwynn, RMA; 07/15/2016 9:51 AM) No Current Medications Medications Reconciled  Social History Yehuda Mao, RMA; 07/15/2016 9:49 AM) Alcohol use  Occasional alcohol use. Caffeine use  Carbonated beverages, Coffee, Tea. No drug use  Tobacco use  Never smoker.  Family History Yehuda Mao, RMA; 07/15/2016 9:49 AM) Diabetes Mellitus  Brother. Heart Disease  Family Members In General. Hypertension  Mother.  Pregnancy / Birth History Yehuda Mao, RMA; 07/15/2016 9:49 AM) Age at menarche  34 years. Gravida  2 Maternal age  63-30 Para  2 Regular periods   Other Problems Yehuda Mao, RMA; 07/15/2016 9:49 AM) Gastroesophageal Reflux Disease  High blood pressure  Thyroid Disease  Ventral Hernia Repair     Review of Systems (Robin Gwynn RMA; 07/15/2016 9:49 AM) General Not Present- Appetite Loss, Chills,  Fatigue, Fever, Night Sweats, Weight Gain and Weight Loss. Skin Not Present- Change in Wart/Mole, Dryness, Hives, Jaundice, New Lesions, Non-Healing Wounds, Rash and Ulcer. HEENT Not Present- Earache, Hearing Loss, Hoarseness, Nose Bleed, Oral Ulcers, Ringing in the Ears, Seasonal Allergies, Sinus Pain, Sore Throat, Visual Disturbances, Wears glasses/contact lenses and Yellow Eyes. Respiratory Not Present- Bloody sputum, Chronic Cough, Difficulty Breathing, Snoring and Wheezing. Breast Not Present- Breast Mass, Breast Pain, Nipple Discharge and Skin Changes. Cardiovascular Not Present- Chest Pain, Difficulty Breathing Lying Down, Leg Cramps, Palpitations, Rapid Heart Rate, Shortness of Breath and Swelling of Extremities. Gastrointestinal Present- Gets full quickly at meals, Indigestion and Nausea. Not Present- Abdominal Pain, Bloating, Bloody Stool, Change in Bowel Habits, Chronic diarrhea, Constipation, Difficulty Swallowing, Excessive gas, Hemorrhoids, Rectal Pain and Vomiting. Female Genitourinary Not Present- Frequency, Nocturia, Painful Urination, Pelvic Pain and Urgency. Musculoskeletal Not Present- Back Pain, Joint Pain, Joint Stiffness, Muscle Pain, Muscle Weakness and Swelling of Extremities. Neurological Not Present- Decreased Memory, Fainting, Headaches, Numbness, Seizures, Tingling, Tremor, Trouble walking and Weakness. Psychiatric Not Present- Anxiety, Bipolar, Change in Sleep Pattern, Depression, Fearful and Frequent crying. Endocrine Not Present- Cold Intolerance, Excessive Hunger, Hair Changes, Heat Intolerance, Hot flashes and New Diabetes. Hematology Not Present- Blood Thinners, Easy Bruising, Excessive bleeding, Gland problems, HIV and Persistent Infections.  Vitals (Robin Gwynn RMA; 07/15/2016 9:51 AM) 07/15/2016 9:51 AM Weight: 195 lb Pulse: 99 (Regular)  BP: 130/70 (Sitting, Left Arm, Standard)       Physical Exam [name] K. Rana Hochstein MD; 07/15/2016 11:57 AM) The  physical exam findings are as follows: Note:WDWN in NAD Eyes: Pupils equal, round; sclera anicteric HENT: Oral mucosa moist; good dentition Neck:  No masses palpated, no thyromegaly Lungs: CTA bilaterally; normal respiratory effort CV: Regular rate and rhythm; no murmurs; extremities well-perfused with no edema Abd: +bowel sounds, soft, non-tender, no palpable organomegaly; healed supraumbilical incision; palpable mass about 4-5 cm above umbilicus - enlarges with Valsalva; reducible Skin: Warm, dry; no sign of jaundice Psychiatric - alert and oriented x 4; calm mood and affect    Assessment & Plan [name] K. Shunte Senseney MD; 07/15/2016 10:17 AM) VENTRAL HERNIA, RECURRENT (K43.2) Current Plans Schedule for Surgery - recommend open repair of small recurrent ventral hernia that has developed above her previous hernia repair. The surgical procedure has been discussed with the patient. Potential risks, benefits, alternative treatments, and expected outcomes have been explained. All of the patient's questions at this time have been answered. The likelihood of reaching the patient's treatment goal is good. The patient understand the proposed surgical procedure and wishes to speak to her husband regarding the scheduling of the surgery.  Dana [name]. Georgette Dover, MD, [facility] Surgery  General/ Trauma Surgery  07/19/2016 11:45 AM

## 2016-09-03 ENCOUNTER — Telehealth: Payer: Self-pay | Admitting: Internal Medicine

## 2016-09-03 NOTE — Telephone Encounter (Signed)
Spoke with pt and she states she feels "decent" right now.  States on Friday or Saturday she decided to check her HR and it was elevated.  States it stayed between 100-120 all weekend.  She's not sure how long it has been like this but she has been more fatigued recently, has occasional chest pressure, notices slight SOB with exertion and states when she is exerting she can "feel heart pounding".  Pt has noticed some general weakness.  It's hard for her to squat or lift items.  Denies swelling or CP.  Scheduled pt to come in and be seen tomorrow.  Advised when appropriate to go to ER.  Pt verbalized understanding and was in agreement with plan.

## 2016-09-03 NOTE — Telephone Encounter (Signed)
New message      Pt states that she has had rapid heart rate since last Friday/Saturday.  She states it is between 100-120 bpm.  An appt was made with Richardson Dopp, PA on 09-10-16.  Not sure if pt needs to be seen sooner.  Pt was ok with appt. Please  Call.

## 2016-09-04 ENCOUNTER — Encounter: Payer: Self-pay | Admitting: Cardiology

## 2016-09-04 ENCOUNTER — Ambulatory Visit (INDEPENDENT_AMBULATORY_CARE_PROVIDER_SITE_OTHER): Payer: BLUE CROSS/BLUE SHIELD | Admitting: Cardiology

## 2016-09-04 ENCOUNTER — Encounter (INDEPENDENT_AMBULATORY_CARE_PROVIDER_SITE_OTHER): Payer: Self-pay

## 2016-09-04 VITALS — BP 126/80 | HR 106 | Ht 65.0 in | Wt 193.0 lb

## 2016-09-04 DIAGNOSIS — R000 Tachycardia, unspecified: Secondary | ICD-10-CM

## 2016-09-04 DIAGNOSIS — I10 Essential (primary) hypertension: Secondary | ICD-10-CM | POA: Diagnosis not present

## 2016-09-04 NOTE — Patient Instructions (Addendum)
Medication Instructions:    Your physician recommends that you continue on your current medications as directed. Please refer to the Current Medication list given to you today.   If you need a refill on your cardiac medications before your next appointment, please call your pharmacy.  Labwork: CBC BMP TSH AND FREE T4  TODAY    Testing/Procedures: 48 HOUR.. Your physician has recommended that you wear a holter monitor. Holter monitors are medical devices that record the heart's electrical activity. Doctors most often use these monitors to diagnose arrhythmias. Arrhythmias are problems with the speed or rhythm of the heartbeat. The monitor is a small, portable device. You can wear one while you do your normal daily activities. This is usually used to diagnose what is causing palpitations/syncope (passing out).   Follow-Up:  IN ONE MONTH WITH DR ROSS   Any Other Special Instructions Will Be Listed Below (If Applicable).

## 2016-09-04 NOTE — Progress Notes (Signed)
Cardiology Office Note   Date:  09/04/2016   ID:  KONI KANNAN, DOB 04-01-1981, MRN [id number]  PCP:  Casilda Carls  Cardiologist:  Dr. Harrington Challenger    Chief Complaint  Patient presents with  . Tachycardia      History of Present Illness: Dana [name] is a 36 y.o. female who presents for elevated HR.   She has a hx of HTN associated with pre-eclampsia and chest pressure,  Hx of Echo with normal EF and mild to mod MR.  Her BP has been labile at times.  She has been able to come off her BP meds per Dr. Alan Ripper instructions.  Today she called in about rapid HR to 130s she feel it racing and checks her pulse.  Has some chest pressure with this.  She does very little caffeine and no OTC meds.  She had elderberry tea this AM.  She will stop in case it causes tachycardia.  The fast Hr occurs with rest.  Otherwise she has been doing well.  Her menses is somewhat irregular as well with increased amount and irregular.  She has a hx of thyroid goiter and was on thyroid supplementation for awhile but not now.    Past Medical History:  Diagnosis Date  . Labile hypertension 06/09/2014  . Thyroid disease   . Ventral hernia 09/04/2011    Past Surgical History:  Procedure Laterality Date  . EYE SURGERY  2006   lasik - both eyes  . HERNIA REPAIR    . TUBAL LIGATION Bilateral 05/30/2014   Procedure: POST PARTUM TUBAL LIGATION;  Surgeon: Ena Dawley, MD;  Location: [facility];  Service: Gynecology;  Laterality: Bilateral;  . VENTRAL HERNIA REPAIR  09/13/2011   Procedure: HERNIA REPAIR VENTRAL ADULT;  Surgeon: Imogene Burn. Georgette Dover, MD;  Location: [facility];  Service: General;  Laterality: N/A;  . WISDOM TOOTH EXTRACTION  2001     No current outpatient prescriptions on file.   No current facility-administered medications for this visit.     Allergies:   Patient has no known allergies.    Social History:  The patient  reports that she has never smoked. She has never used smokeless  tobacco. She reports that she does not drink alcohol or use drugs.   Family History:  The patient's family history includes Cancer in her maternal grandmother; Heart disease in her maternal grandfather; Hypertension in her maternal grandmother and mother.    ROS:  General:no colds or fevers, no weight changes Skin:no rashes or ulcers HEENT:no blurred vision, no congestion CV:see HPI PUL:see HPI GI:no diarrhea constipation or melena, no indigestion GU:no hematuria, no dysuria MS:no joint pain, no claudication Neuro:no syncope, no lightheadedness Endo:no diabetes, no thyroid disease- hx of goiter  Wt Readings from Last 3 Encounters:  09/04/16 193 lb (87.5 kg)  12/27/14 193 lb (87.5 kg)  11/10/14 191 lb 12.8 oz (87 kg)     PHYSICAL EXAM: VS:  BP 126/80   Pulse (!) 106   Ht 5\' 5"  (1.651 m)   Wt 193 lb (87.5 kg)   LMP 08/14/2016 Comment: she has had BTL  SpO2 98%   BMI 32.12 kg/m  , BMI Body mass index is 32.12 kg/m. General:Pleasant affect, NAD Skin:Warm and dry, brisk capillary refill HEENT:normocephalic, sclera clear, mucus membranes moist Neck:supple, no JVD, no bruits  Heart:S1S2 RRR without murmur, gallup, rub or click Lungs:clear without rales, rhonchi, or wheezes QHU:TMLY, non tender, + BS, do not palpate liver spleen  or masses Ext:no lower ext edema, 2+ pedal pulses, 2+ radial pulses Neuro:alert and oriented, MAE, follows commands, + facial symmetry    EKG:  EKG is ordered today. The ekg ordered today demonstrates ST at 101, no changes from previous.    Recent Labs: No results found for requested labs within last 8760 hours.    Lipid Panel No results found for: CHOL, TRIG, HDL, CHOLHDL, VLDL, LDLCALC, LDLDIRECT     Other studies Reviewed: Additional studies/ records that were reviewed today include:   Previous holter monitor with SR to ST and ST was 134 but no chest pain associated with the ST . ECHO: 06/2014   Study Conclusions  - Left ventricle:  The cavity size was normal. Wall thickness was normal. Systolic function was normal. The estimated ejection fraction was in the range of 55% to 60%. Wall motion was normal; there were no regional wall motion abnormalities. Left ventricular diastolic function parameters were normal. - Mitral valve: There was mild to moderate regurgitation directed centrally. - Left atrium: The atrium was mildly dilated. - Pulmonary arteries: PA peak pressure: 35 mm Hg (S).  ASSESSMENT AND PLAN:  1.  Tachycardia with chest pressure.  Will have her wear 48 hour holter, will check TSH, CBC, BMP and free T4. She will follow up with Dr. Harrington Challenger in 3-4 weeks.   2.  Hx of HTN with pre-eclampsia   3. Hx of thyroid goiter.   Current medicines are reviewed with the patient today.  The patient Has no concerns regarding medicines.  The following changes have been made:  See above Labs/ tests ordered today include:see above  Disposition:   FU:  see above  Signed, Cecilie Kicks, NP  09/04/2016 4:54 PM    [facility], [place], [place], [place] Phone: [phone]; Fax: [phone]

## 2016-09-05 ENCOUNTER — Telehealth: Payer: Self-pay | Admitting: Internal Medicine

## 2016-09-05 LAB — CBC
HEMATOCRIT: 35.6 % (ref 34.0–46.6)
Hemoglobin: 11.8 g/dL (ref 11.1–15.9)
MCH: 26.8 pg (ref 26.6–33.0)
MCHC: 33.1 g/dL (ref 31.5–35.7)
MCV: 81 fL (ref 79–97)
PLATELETS: 178 10*3/uL (ref 150–379)
RBC: 4.4 x10E6/uL (ref 3.77–5.28)
RDW: 12.5 % (ref 12.3–15.4)
WBC: 4.8 10*3/uL (ref 3.4–10.8)

## 2016-09-05 LAB — BASIC METABOLIC PANEL
BUN / CREAT RATIO: 37 — AB (ref 9–23)
BUN: 15 mg/dL (ref 6–20)
CHLORIDE: 104 mmol/L (ref 96–106)
CO2: 26 mmol/L (ref 18–29)
Calcium: 9.1 mg/dL (ref 8.7–10.2)
Creatinine, Ser: 0.41 mg/dL — ABNORMAL LOW (ref 0.57–1.00)
GFR, EST AFRICAN AMERICAN: 153 mL/min/{1.73_m2} (ref >59)
GFR, EST NON AFRICAN AMERICAN: 132 mL/min/{1.73_m2} (ref >59)
Glucose: 93 mg/dL (ref 65–99)
Potassium: 4.2 mmol/L (ref 3.5–5.2)
Sodium: 144 mmol/L (ref 134–144)

## 2016-09-05 LAB — T4, FREE: Free T4: 6.38 ng/dL — ABNORMAL HIGH (ref 0.82–1.77)

## 2016-09-05 LAB — TSH: TSH: <0.006 u[IU]/mL — ABNORMAL LOW (ref 0.450–4.500)

## 2016-09-05 NOTE — Telephone Encounter (Signed)
Patient seen in clinic yesterday for tachycardia.  Today she was instructed to follow up with PCP for her abnormal TSH.   Since she does not have a PCP she wanted to request a referral to endocrine.   I gave pt number for [facility] PCP referral line.  I sent a message to Dr. Harrington Challenger and Cecilie Kicks regarding endocrine referral.  Pt is aware I will call her back with any further recommendations.

## 2016-09-05 NOTE — Telephone Encounter (Signed)
New message      Talk to the nurse regarding a referral to an endocrinologist

## 2016-09-06 NOTE — Telephone Encounter (Signed)
Dr. Harrington Challenger contacted Dr. Cordelia Pen office.  They will call patient and schedule an appointment with Dr. Loanne Drilling and also add her to cancellation list.   Last ov note and labs routed via fax to Dr. Loanne Drilling.

## 2016-09-10 ENCOUNTER — Ambulatory Visit: Payer: BLUE CROSS/BLUE SHIELD | Admitting: Physician Assistant

## 2016-09-19 ENCOUNTER — Ambulatory Visit (INDEPENDENT_AMBULATORY_CARE_PROVIDER_SITE_OTHER): Payer: BLUE CROSS/BLUE SHIELD

## 2016-09-19 DIAGNOSIS — R000 Tachycardia, unspecified: Secondary | ICD-10-CM | POA: Diagnosis not present

## 2016-09-20 ENCOUNTER — Encounter: Payer: Self-pay | Admitting: *Deleted

## 2016-10-08 ENCOUNTER — Encounter: Payer: Self-pay | Admitting: Internal Medicine

## 2016-10-10 NOTE — Progress Notes (Deleted)
   Cardiology Office Note   Date:  10/10/2016   ID:  Dana [name], DOB 10/09/1980, MRN [id number]  PCP:  Dana Hippo, MD  Cardiologist:   Dana Carnes, MD       History of Present Illness: Dana [name] is a 36 y.o. female with a history of HTN, preeclampsia and chest pressure  Echo with normal LV function mild to mod MR  Pt seen on 5/2 by Dana [name] for tachycardiac   She was set up for a 48 hour holter   This shhowed AVg HR 105 bpm  (SR/ST)  [name] was noted to be hyperthyroid  Reocmm thyrodi control and adding Toprol 25       No outpatient prescriptions have been marked as taking for the 10/11/16 encounter (Appointment) with Dana Records, MD.     Allergies:   Patient has no known allergies.   Past Medical History:  Diagnosis Date  . Labile hypertension 06/09/2014  . Thyroid disease   . Ventral hernia 09/04/2011    Past Surgical History:  Procedure Laterality Date  . EYE SURGERY  2006   lasik - both eyes  . HERNIA REPAIR    . TUBAL LIGATION Bilateral 05/30/2014   Procedure: POST PARTUM TUBAL LIGATION;  Surgeon: Dana Dawley, MD;  Location: [facility];  Service: Gynecology;  Laterality: Bilateral;  . VENTRAL HERNIA REPAIR  09/13/2011   Procedure: HERNIA REPAIR VENTRAL ADULT;  Surgeon: Dana Burn. Georgette Dover, MD;  Location: [facility];  Service: General;  Laterality: N/A;  . WISDOM TOOTH EXTRACTION  2001     Social History:  The patient  reports that she has never smoked. She has never used smokeless tobacco. She reports that she does not drink alcohol or use drugs.   Family History:  The patient's family history includes Cancer in her maternal grandmother; Heart disease in her maternal grandfather; Hypertension in her maternal grandmother and mother.    ROS:  Please see the history of present illness. All other systems are reviewed and  Negative to the above problem except as noted.    PHYSICAL EXAM: VS:  There were no vitals taken for this visit.    GEN: Well nourished, well developed, in no acute distress  HEENT: normal  Neck: no JVD, carotid bruits, or masses Cardiac: RRR; no murmurs, rubs, or gallops,no edema  Respiratory:  clear to auscultation bilaterally, normal work of breathing GI: soft, nontender, nondistended, + BS  No hepatomegaly  MS: no deformity Moving all extremities   Skin: warm and dry, no rash Neuro:  Strength and sensation are intact Psych: euthymic mood, full affect   EKG:  EKG is ordered today.   Lipid Panel No results found for: CHOL, TRIG, HDL, CHOLHDL, VLDL, LDLCALC, LDLDIRECT    Wt Readings from Last 3 Encounters:  09/04/16 87.5 kg (193 lb)  12/27/14 87.5 kg (193 lb)  11/10/14 87 kg (191 lb 12.8 oz)      ASSESSMENT AND PLAN:     Current medicines are reviewed at length with the patient today.  The patient does not have concerns regarding medicines.  Signed, Dana Carnes, MD  10/10/2016 1:46 PM    [facility] [address], [place], [place]  [address] Phone: [phone]; Fax: [phone]

## 2016-10-11 ENCOUNTER — Ambulatory Visit: Payer: BLUE CROSS/BLUE SHIELD | Admitting: Internal Medicine

## 2016-10-17 ENCOUNTER — Other Ambulatory Visit: Payer: Self-pay | Admitting: *Deleted

## 2016-10-17 ENCOUNTER — Encounter: Payer: Self-pay | Admitting: *Deleted

## 2016-12-26 ENCOUNTER — Ambulatory Visit: Payer: BLUE CROSS/BLUE SHIELD | Admitting: Internal Medicine

## 2017-02-10 ENCOUNTER — Ambulatory Visit: Payer: Self-pay | Admitting: Surgery

## 2017-02-10 NOTE — H&P (Signed)
History of Present Illness Dana [name]. Dana Gladu MD; 02/10/2017 8:50 AM) The patient is a 36 year old female who presents with an umbilical hernia. The patient is a 36 year old female who presents with a ventral hernia. This patient is status post open repair of a 1.5 cm supraumbilical ventral hernia on 09/13/11. We repaired this with a small proceed ventral patch. The patient became pregnant and has a 8-year-old son. Since delivering her child, she has noticed some bulging above her previous incision. She gets some discomfort in this area. The mass has not enlarged but is becoming more frequently uncomfortable. She denies any GI obstructive symptoms. She does report feeling that she gets full faster when she eats. No imaging of this area. She was discovered to have Graves' disease and is now on medication with good control.   Problem List/Past Medical Dana [name] K. Dana Fong, MD; 02/10/2017 8:50 AM) VENTRAL HERNIA, RECURRENT (K43.2)  Past Surgical History Dana [name]. Dana Franklin, MD; 02/10/2017 8:50 AM) Oral Surgery Ventral / Umbilical Hernia Surgery Bilateral.  Diagnostic Studies History Dana [name]. Dana Brightbill, MD; 02/10/2017 8:50 AM) Colonoscopy never Mammogram never Pap Smear 1-5 years ago  Allergies Dana [name] K. Aquarius [name], MD; 02/10/2017 8:50 AM) No Known Drug Allergies 07/15/2016  Medication History (Dana [name], CMA; 02/10/2017 8:37 AM) MethIMAzole (10MG  Tablet, Oral) Active. Propranolol HCl (10MG  Tablet, Oral) Active. Omega 3 (1000MG  Capsule, Oral) Active. No Current Medications (Taken starting 02/10/2017) Vitamin D (Cholecalciferol) (1000UNIT Capsule, Oral) Active. Multivitamin Adult (Oral) Active.  Social History Dana [name]. Dana Heslin, MD; 02/10/2017 8:50 AM) Alcohol use Occasional alcohol use. Caffeine use Carbonated beverages, Coffee, Tea. No drug use Tobacco use Never smoker.  Family History Dana [name]. Dana Tumminello, MD; 02/10/2017 8:50 AM) Diabetes Mellitus Brother. Heart Disease  Family Members In General. Hypertension Mother.  Pregnancy / Birth History Dana [name]. Dana Bennetts, MD; 02/10/2017 8:50 AM) Age at menarche 18 years. Gravida 2 Maternal age 54-30 Para 2 Regular periods  Other Problems Dana [name]. Dana Nethery, MD; 02/10/2017 8:50 AM) Gastroesophageal Reflux Disease High blood pressure Thyroid Disease Ventral Hernia Repair     Physical Exam Dana [name] K. Dana Kras MD; 02/10/2017 8:50 AM)  The physical exam findings are as follows: Note:WDWN in NAD Eyes: Pupils equal, round; sclera anicteric HENT: Oral mucosa moist; good dentition Neck: No masses palpated, no thyromegaly Lungs: CTA bilaterally; normal respiratory effort CV: Regular rate and rhythm; no murmurs; extremities well-perfused with no edema Abd: +bowel sounds, soft, non-tender, no palpable organomegaly; healed supraumbilical incision; palpable mass about 4-5 cm above umbilicus - enlarges with Valsalva; reducible Skin: Warm, dry; no sign of jaundice Psychiatric - alert and oriented x 4; calm mood and affect    Assessment & Plan Dana [name] K. 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 The surgical procedure has been discussed with the patient. Potential risks, benefits, alternative treatments, and expected outcomes have been explained. All of the patient's questions at this time have been answered. The likelihood of reaching the patient's treatment goal is good. The patient understand the proposed surgical procedure and wishes to proceed.

## 2017-05-09 ENCOUNTER — Emergency Department (HOSPITAL_COMMUNITY): Payer: BLUE CROSS/BLUE SHIELD

## 2017-05-09 ENCOUNTER — Encounter (HOSPITAL_COMMUNITY): Payer: Self-pay

## 2017-05-09 ENCOUNTER — Other Ambulatory Visit: Payer: Self-pay

## 2017-05-09 ENCOUNTER — Emergency Department (HOSPITAL_COMMUNITY)
Admission: EM | Admit: 2017-05-09 | Discharge: 2017-05-09 | Disposition: A | Discharge status: Home / Self Care | Payer: BLUE CROSS/BLUE SHIELD | Attending: Emergency Medicine | Admitting: Emergency Medicine

## 2017-05-09 DIAGNOSIS — N132 Hydronephrosis with renal and ureteral calculous obstruction: Secondary | ICD-10-CM | POA: Diagnosis not present

## 2017-05-09 DIAGNOSIS — R112 Nausea with vomiting, unspecified: Secondary | ICD-10-CM | POA: Diagnosis not present

## 2017-05-09 DIAGNOSIS — R109 Unspecified abdominal pain: Secondary | ICD-10-CM | POA: Diagnosis present

## 2017-05-09 DIAGNOSIS — N201 Calculus of ureter: Secondary | ICD-10-CM

## 2017-05-09 LAB — URINALYSIS, ROUTINE W REFLEX MICROSCOPIC
BILIRUBIN URINE: NEGATIVE
GLUCOSE, UA: NEGATIVE mg/dL
KETONES UR: NEGATIVE mg/dL
NITRITE: NEGATIVE
PROTEIN: 30 mg/dL — AB
Specific Gravity, Urine: 1.016 (ref 1.005–1.030)
pH: 7 (ref 5.0–8.0)

## 2017-05-09 LAB — COMPREHENSIVE METABOLIC PANEL
ALT: 16 U/L (ref 14–54)
ANION GAP: 7 (ref 5–15)
AST: 17 U/L (ref 15–41)
Albumin: 3.8 g/dL (ref 3.5–5.0)
Alkaline Phosphatase: 102 U/L (ref 38–126)
BUN: 8 mg/dL (ref 6–20)
CALCIUM: 8.7 mg/dL — AB (ref 8.9–10.3)
CO2: 27 mmol/L (ref 22–32)
Chloride: 104 mmol/L (ref 101–111)
Creatinine, Ser: 0.91 mg/dL (ref 0.44–1.00)
Glucose, Bld: 144 mg/dL — ABNORMAL HIGH (ref 65–99)
Potassium: 4 mmol/L (ref 3.5–5.1)
SODIUM: 138 mmol/L (ref 135–145)
Total Bilirubin: 0.6 mg/dL (ref 0.3–1.2)
Total Protein: 6.8 g/dL (ref 6.5–8.1)

## 2017-05-09 LAB — I-STAT BETA HCG BLOOD, ED (MC, WL, AP ONLY): I-stat hCG, quantitative: <5 m[IU]/mL (ref <5)

## 2017-05-09 LAB — CBC
HCT: 41.4 % (ref 36.0–46.0)
HEMOGLOBIN: 12.9 g/dL (ref 12.0–15.0)
MCH: 28.2 pg (ref 26.0–34.0)
MCHC: 31.2 g/dL (ref 30.0–36.0)
MCV: 90.6 fL (ref 78.0–100.0)
Platelets: 212 10*3/uL (ref 150–400)
RBC: 4.57 MIL/uL (ref 3.87–5.11)
RDW: 13.4 % (ref 11.5–15.5)
WBC: 9.5 10*3/uL (ref 4.0–10.5)

## 2017-05-09 LAB — HCG, QUANTITATIVE, PREGNANCY: hCG, Beta Chain, Quant, S: <1 m[IU]/mL (ref <5)

## 2017-05-09 LAB — LIPASE, BLOOD: LIPASE: 26 U/L (ref 11–51)

## 2017-05-09 MED ORDER — KETOROLAC TROMETHAMINE 30 MG/ML IJ SOLN
30.0000 mg | Freq: Once | INTRAMUSCULAR | Status: AC
  Administered 2017-05-09: 30 mg via INTRAVENOUS
  Filled 2017-05-09: qty 1

## 2017-05-09 MED ORDER — OXYCODONE-ACETAMINOPHEN 5-325 MG PO TABS: 1.0000 | ORAL_TABLET | ORAL | 0 refills | Status: AC | PRN

## 2017-05-09 MED ORDER — ONDANSETRON 8 MG PO TBDP: 8.0000 mg | ORAL_TABLET | Freq: Three times a day (TID) | ORAL | 0 refills | Status: AC | PRN

## 2017-05-09 MED ORDER — SODIUM CHLORIDE 0.9 % IV BOLUS (SEPSIS)
1000.0000 mL | Freq: Once | INTRAVENOUS | Status: AC
  Administered 2017-05-09: 1000 mL via INTRAVENOUS

## 2017-05-09 MED ORDER — ONDANSETRON 4 MG PO TBDP
4.0000 mg | ORAL_TABLET | Freq: Once | ORAL | Status: AC | PRN
  Administered 2017-05-09: 4 mg via ORAL
  Filled 2017-05-09: qty 1

## 2017-05-09 MED ORDER — OXYCODONE-ACETAMINOPHEN 5-325 MG PO TABS
1.0000 | ORAL_TABLET | ORAL | Status: DC | PRN
  Administered 2017-05-09: 1 via ORAL
  Filled 2017-05-09: qty 1

## 2017-05-09 NOTE — ED Notes (Signed)
Patient transported to CT 

## 2017-05-09 NOTE — ED Provider Notes (Signed)
[facility] EMERGENCY DEPARTMENT Provider Note   CSN: [id number] Arrival date & time: 05/09/17  1028     History   Chief Complaint Chief Complaint  Patient presents with  . Abdominal Pain  . Flank Pain    HPI Dana [name] is a 37 y.o. female.  HPI  37 year old female history of kidney stones presents today with right flank pain that began this a.m.  She states that has become severe but is waxing and waning in nature.  She was given Percocet prior to my evaluation pain is still currently at an 8.  She has had some nausea and vomiting.  It is nonbilious and nonbloody.  She denies any pain with urination although she has some increased frequency of urination with this.  She describes it as being similar to previous kidney stone pain.  Past Medical History:  Diagnosis Date  . Labile hypertension 06/09/2014  . Thyroid disease   . Ventral hernia 09/04/2011    Patient Active Problem List   Diagnosis Date Noted  . Palpitations 09/21/2014  . Labile hypertension 06/09/2014  . Mitral valvular regurgitation: Mild-moderate 06/09/2014  . Pre-eclamptic toxemia 06/08/2014  . Preeclampsia in postpartum period 06/06/2014  . Second-degree perineal laceration, with delivery 05/29/2014  . Velamentous insertion of umbilical cord 50/93/2671  . SVD (spontaneous vaginal delivery) 05/29/2014  . Fibroids, subserous - measuring 2x2 cm in left anterior corpus 11/01/2013  . Subchorionic hemorrhage in first trimester 11/01/2013  . Ventral hernia 09/04/2011    Past Surgical History:  Procedure Laterality Date  . EYE SURGERY  2006   lasik - both eyes  . HERNIA REPAIR    . TUBAL LIGATION Bilateral 05/30/2014   Procedure: POST PARTUM TUBAL LIGATION;  Surgeon: Ena Dawley, MD;  Location: [facility];  Service: Gynecology;  Laterality: Bilateral;  . VENTRAL HERNIA REPAIR  09/13/2011   Procedure: HERNIA REPAIR VENTRAL ADULT;  Surgeon: Imogene Burn. Georgette Dover, MD;  Location: [facility];  Service: General;  Laterality: N/A;  . WISDOM TOOTH EXTRACTION  2001    OB History    Gravida Para Term Preterm AB Living   2 2 2     1    SAB TAB Ectopic Multiple Live Births         0 1       Home Medications    Prior to Admission medications   Medication Sig Start Date End Date Taking? Authorizing Provider  propranolol (INDERAL) 20 MG tablet Take 20 mg by mouth 2 (two) times daily.    [provider]    Family History Family History  Problem Relation Age of Onset  . Cancer Maternal Grandmother        lung  . Hypertension Maternal Grandmother   . Hypertension Mother   . Heart disease Maternal Grandfather     Social History Social History   Tobacco Use  . Smoking status: Never Smoker  . Smokeless tobacco: Never Used  Substance Use Topics  . Alcohol use: No  . Drug use: No     Allergies   Patient has no known allergies.   Review of Systems Review of Systems  All other systems reviewed and are negative.    Physical Exam Updated Vital Signs BP 120/74   Pulse 65   Temp 98 F (36.7 C) (Oral)   Resp 13   Ht 1.651 m (5\' 5" )   Wt 90.7 kg (200 lb)   LMP 04/08/2017   SpO2 100%  BMI 33.28 kg/m   Physical Exam  Constitutional: She appears well-developed and well-nourished.  HENT:  Head: Normocephalic.  Mouth/Throat: Oropharynx is clear and moist.  Eyes: Pupils are equal, round, and reactive to light.  Cardiovascular: Normal rate and normal heart sounds.  Pulmonary/Chest: Effort normal.  Abdominal: Soft. Normal appearance and bowel sounds are normal.  Neurological: She is alert.  Skin: Skin is warm. Capillary refill takes less than 2 seconds.  Psychiatric: She has a normal mood and affect.  Nursing note and vitals reviewed.    ED Treatments / Results  Labs (all labs ordered are listed, but only abnormal results are displayed) Labs Reviewed  COMPREHENSIVE METABOLIC PANEL - Abnormal; Notable for the following components:       Result Value   Glucose, Bld 144 (*)    Calcium 8.7 (*)    All other components within normal limits  URINALYSIS, ROUTINE W REFLEX MICROSCOPIC - Abnormal; Notable for the following components:   APPearance HAZY (*)    Hgb urine dipstick LARGE (*)    Protein, ur 30 (*)    Leukocytes, UA TRACE (*)    Bacteria, UA RARE (*)    Squamous Epithelial / LPF 0-5 (*)    All other components within normal limits  LIPASE, BLOOD  CBC  HCG, QUANTITATIVE, PREGNANCY  I-STAT BETA HCG BLOOD, ED ([facility], [facility], [facility] ONLY)    EKG  EKG Interpretation None       Radiology Ct Renal Stone Study  Result Date: 05/09/2017 CLINICAL DATA:  Right flank and right abdominal pain for 1 day with nausea and vomiting. History of nephrolithiasis. EXAM: CT ABDOMEN AND PELVIS WITHOUT CONTRAST TECHNIQUE: Multidetector CT imaging of the abdomen and pelvis was performed following the standard protocol without IV contrast. COMPARISON:  None. FINDINGS: Lower chest: No significant pulmonary nodules or acute consolidative airspace disease. Hepatobiliary: Normal liver size. No liver mass. Normal gallbladder with no radiopaque cholelithiasis. No biliary ductal dilatation. Pancreas: Normal, with no mass or duct dilation. Spleen: Normal size. No mass. Adrenals/Urinary Tract: Normal adrenals. Obstructing 3 mm right ureterovesical junction stone with mild right hydroureteronephrosis. Nonobstructing 6 mm interpolar right renal stone. Additional punctate nonobstructing 1 mm stones in the upper kidneys bilaterally. No additional right ureteral stones. No left hydronephrosis. Normal caliber left ureter, with no left ureteral stones. No contour deforming renal masses. Normal bladder. Stomach/Bowel: Normal non-distended stomach. Normal caliber small bowel with no small bowel wall thickening. Normal appendix . Normal large bowel with no diverticulosis, large bowel wall thickening or pericolonic fat stranding. Vascular/Lymphatic: Normal caliber abdominal  aorta. No pathologically enlarged lymph nodes in the abdomen or pelvis. Reproductive: Mildly enlarged anteverted uterus with exophytic calcified 1.3 cm anterior upper uterine body fibroid. No adnexal masses. Other: No pneumoperitoneum, ascites or focal fluid collection. Musculoskeletal: No aggressive appearing focal osseous lesions. IMPRESSION: 1. Obstructing 3 mm right UVJ stone with mild right hydroureteronephrosis. 2. Additional nonobstructing bilateral renal stones. 3. Small calcified anterior upper uterine body exophytic fibroid. Electronically Signed   By: Ilona Sorrel M.D.   On: 05/09/2017 14:47    Procedures Procedures (including critical care time)  Medications Ordered in ED Medications  oxyCODONE-acetaminophen (PERCOCET/ROXICET) 5-325 MG per tablet 1 tablet (1 tablet Oral Given 05/09/17 1137)  ondansetron (ZOFRAN-ODT) disintegrating tablet 4 mg (4 mg Oral Given 05/09/17 1137)  sodium chloride 0.9 % bolus 1,000 mL (1,000 mLs Intravenous New Bag/Given 05/09/17 1338)  ketorolac (TORADOL) 30 MG/ML injection 30 mg (30 mg Intravenous Given 05/09/17 1338)  Initial Impression / Assessment and Plan / ED Course  I have reviewed the triage vital signs and the nursing notes.  Pertinent labs & imaging results that were available during my care of the patient were reviewed by me and considered in my medical decision making (see chart for details).    Patient with pain resolution after Toradol.  3 mm distal ureteral stone obstructing. Plan outpatient pain management with referral to urology. Final Clinical Impressions(s) / ED Diagnoses   Final diagnoses:  Right ureteral stone    ED Discharge Orders        Ordered    oxyCODONE-acetaminophen (PERCOCET/ROXICET) 5-325 MG tablet  Every 4 hours PRN     05/09/17 1457    ondansetron (ZOFRAN ODT) 8 MG disintegrating tablet  Every 8 hours PRN     05/09/17 1457       Pattricia Boss, MD 05/09/17 1457

## 2017-05-09 NOTE — ED Triage Notes (Signed)
Rt. Flank pain /rt. Abdominal pain began yesterday.  PT.; also having n/v. Pt. Has a hx of kidney stone.  Pt. Is alert and oriented X4.    Pt. Denies any urgency, dysuria, vaginal discharge.

## 2018-01-30 ENCOUNTER — Other Ambulatory Visit: Payer: Self-pay | Admitting: Family Medicine

## 2018-01-30 DIAGNOSIS — H93A2 Pulsatile tinnitus, left ear: Secondary | ICD-10-CM

## 2018-02-16 ENCOUNTER — Ambulatory Visit
Admission: RE | Admit: 2018-02-16 | Discharge: 2018-02-16 | Disposition: A | Discharge status: Home / Self Care | Payer: BLUE CROSS/BLUE SHIELD | Source: Ambulatory Visit | Attending: Family Medicine | Admitting: Family Medicine

## 2018-02-16 ENCOUNTER — Encounter: Payer: Self-pay | Admitting: Radiology

## 2018-02-16 DIAGNOSIS — H93A2 Pulsatile tinnitus, left ear: Secondary | ICD-10-CM

## 2018-02-16 MED ORDER — GADOBENATE DIMEGLUMINE 529 MG/ML IV SOLN
20.0000 mL | Freq: Once | INTRAVENOUS | Status: AC | PRN
  Administered 2018-02-16: 20 mL via INTRAVENOUS

## 2018-04-23 ENCOUNTER — Other Ambulatory Visit: Payer: Self-pay | Admitting: Obstetrics and Gynecology

## 2019-02-06 IMAGING — MR MR HEAD WO/W CM
12 series · 48 of 48 positions shown · IV contrast (multihance)
Comparison: Head CT 06/05/2014

CLINICAL DATA: Evaluate for tumor or vascular anomaly. Pulsatile
tinnitus in the left ear.

EXAM:
MRI HEAD WITHOUT AND WITH CONTRAST
TECHNIQUE: Multiplanar, multiecho pulse sequences of the brain and surrounding
structures were obtained without and with intravenous contrast.
CONTRAST:  20mL MULTIHANCE GADOBENATE DIMEGLUMINE 529 MG/ML IV SOLN

[Series 5: T1 · sagittal · 4.0mm · 0.75mm/px · 1 of 29 slices shown (1 of 3)]
[im 1/29]
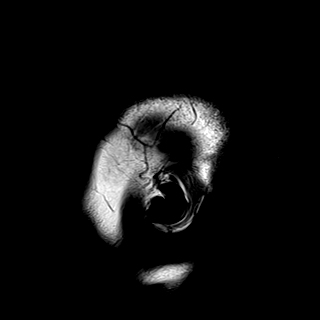

[Series 6: DWI · axial · 3.0mm · 1.44mm/px · z∈[-79,+55]mm · 6 of 86 slices shown (1 of 4)]
[im 1/86]
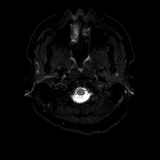
[im 18/86]
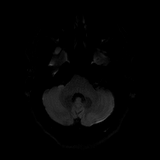
[im 35/86]
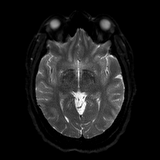
[im 52/86]
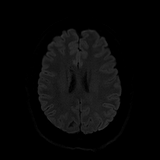
[im 69/86]
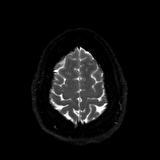
[im 86/86]
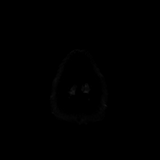

[Series 7: DWI · axial · 3.0mm · 1.44mm/px · z∈[-79,+55]mm · 3 of 43 slices shown (2 of 4)]
[im 1/43]
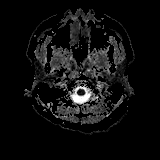
[im 22/43]
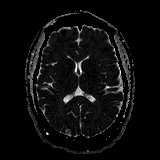
[im 43/43]
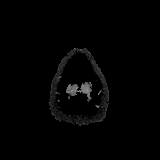

[Series 8: DWI · coronal · 5.0mm · 1.44mm/px · 4 of 60 slices shown (3 of 4)]
[im 1/60]
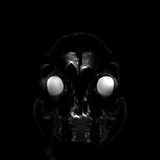
[im 20/60]
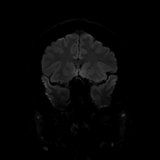
[im 40/60]
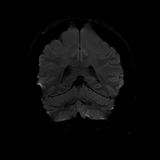
[im 60/60]
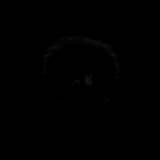

[Series 9: DWI · coronal · 5.0mm · 1.44mm/px · 2 of 30 slices shown (4 of 4)]
[im 1/30]
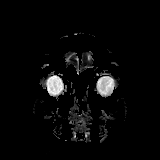
[im 30/30]
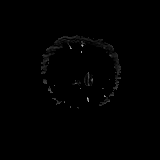

[Series 10: T2 · axial · 4.0mm · 0.36mm/px · z∈[-78,+52]mm · 2 of 27 slices shown]
[im 1/27]
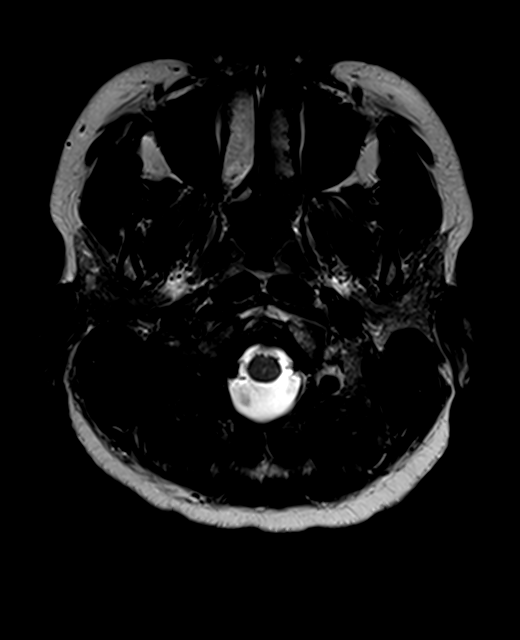
[im 27/27]
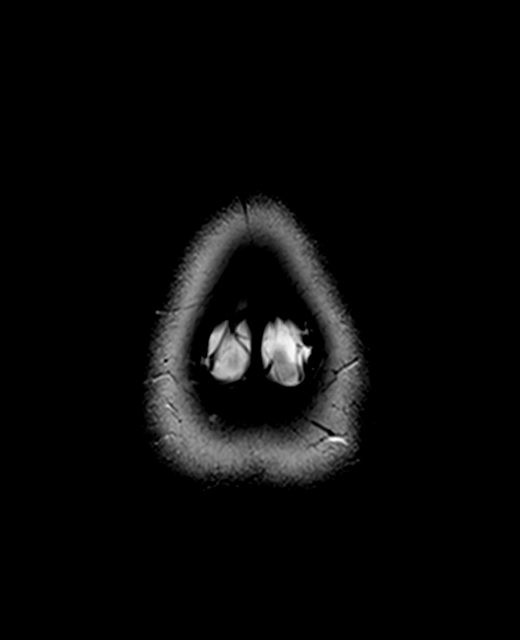

[Series 11: FLAIR · axial · 3.0mm · 0.72mm/px · z∈[-86,+59]mm · 2 of 26 slices shown]
[im 1/26]
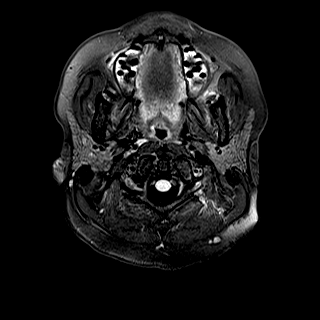
[im 26/26]
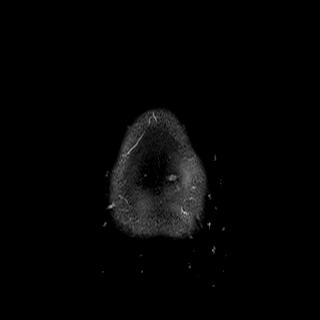

[Series 13: swi_images · axial · 1.5mm · 0.90mm/px · z∈[-82,+56]mm · 6 of 96 slices shown]
[im 1/96]
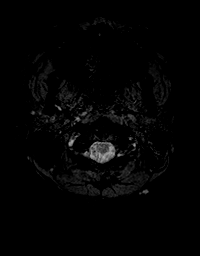
[im 20/96]
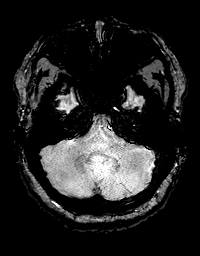
[im 39/96]
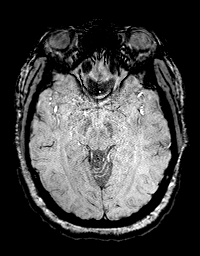
[im 58/96]
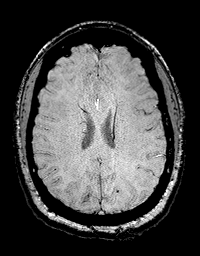
[im 77/96]
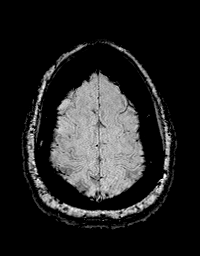
[im 96/96]
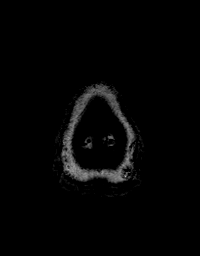

[Series 14: T1 · axial · 1.0mm · 0.90mm/px · z∈[-82,+56]mm · 9 of 144 slices shown (2 of 3)]
[im 1/144]
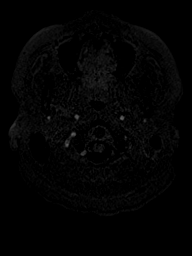
[im 18/144]
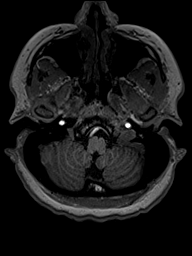
[im 36/144]
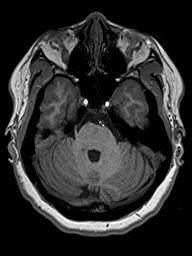
[im 54/144]
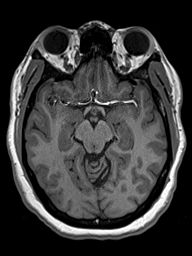
[im 72/144]
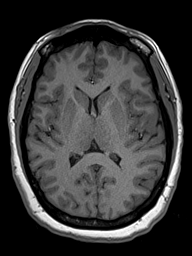
[im 90/144]
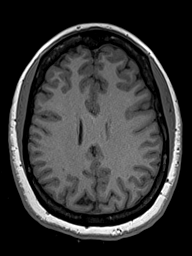
[im 108/144]
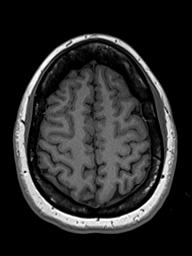
[im 126/144]
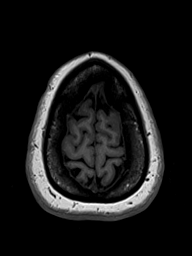
[im 144/144]
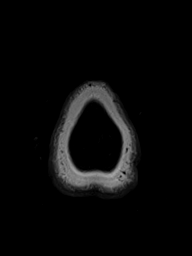

[Series 15: T2 post-contrast · coronal · 4.0mm · 0.36mm/px · 2 of 31 slices shown]
[im 1/31]
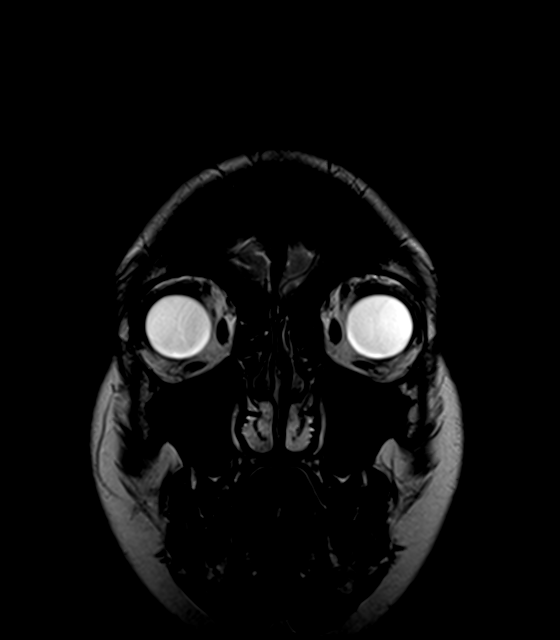
[im 31/31]
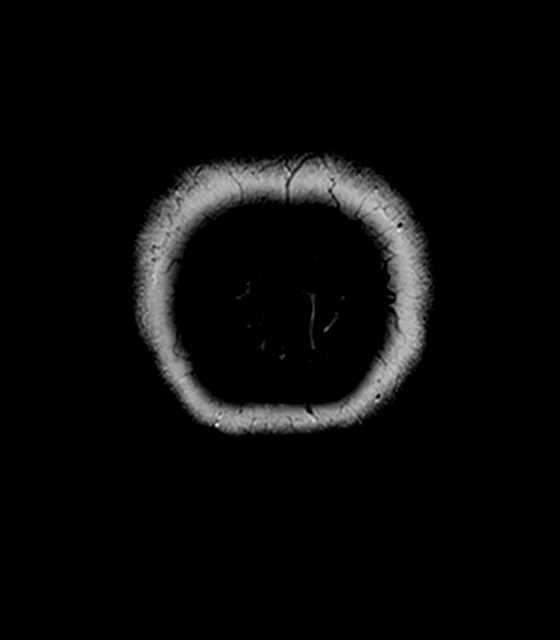

[Series 16: T1 · axial · 1.0mm · 0.90mm/px · z∈[-82,+56]mm · 9 of 144 slices shown (3 of 3)]
[im 1/144]
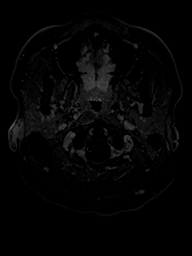
[im 18/144]
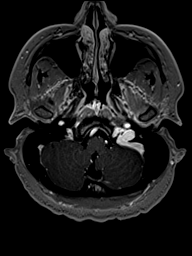
[im 36/144]
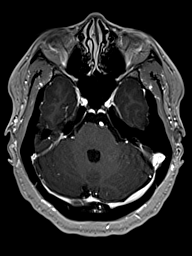
[im 54/144]
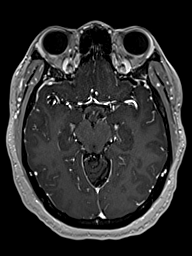
[im 72/144]
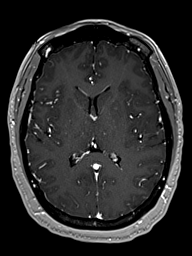
[im 90/144]
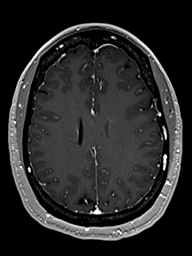
[im 108/144]
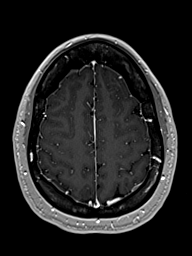
[im 126/144]
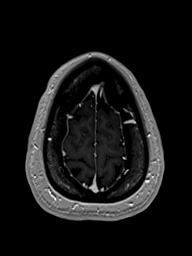
[im 144/144]
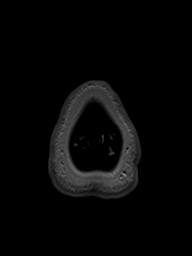

[Series 17: T1 post-contrast · coronal · 4.0mm · 0.72mm/px · 2 of 31 slices shown]
[im 1/31]
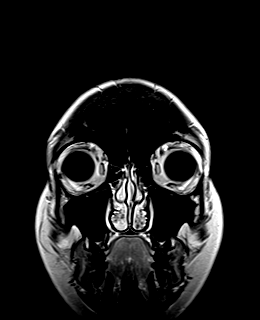
[im 31/31]
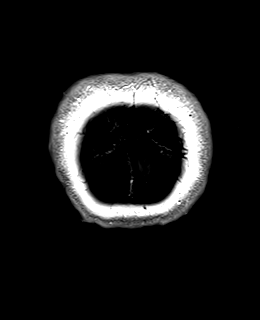

[48 of 48 positions shown; findings below may reference images not displayed]

FINDINGS: Brain: Symmetric unremarkable signal in the labyrinth. Unremarkable
cisterns. Mild flattening of the left pons from basilar tortuosity.
The supratentorial brain is also negative. There is no infarct,
demyelination, mass, hydrocephalus, or collection.

Vascular: Major flow voids and vascular enhancements are preserved.
On the symptomatic left side the transverse sigmoid dural venous
system is strongly dominant and there is a lateral outpouching at
the transverse sigmoid junction. It is unclear from 0586 head CT if
this is bone covered. No abnormal tangle of vessels to suggest
fistula. No evident vessel stenosis. No detected or aneurysm.

Skull and upper cervical spine: No evident marrow lesion

Sinuses/Orbits: Negative
IMPRESSION: 1. The symptomatic left side has the dominant venous outflow with a
diverticulum at the transverse sigmoid junction. It is unclear from
a 0586 head CT if this diverticulum is bone covered.
2. Normal appearance of the brain itself.

## 2019-12-15 ENCOUNTER — Other Ambulatory Visit: Payer: Self-pay | Admitting: Family Medicine

## 2019-12-15 DIAGNOSIS — R5381 Other malaise: Secondary | ICD-10-CM

## 2019-12-17 ENCOUNTER — Other Ambulatory Visit: Payer: Self-pay | Admitting: Family Medicine

## 2019-12-17 DIAGNOSIS — E0590 Thyrotoxicosis, unspecified without thyrotoxic crisis or storm: Secondary | ICD-10-CM

## 2020-03-16 ENCOUNTER — Ambulatory Visit
Admission: RE | Admit: 2020-03-16 | Discharge: 2020-03-16 | Disposition: A | Discharge status: Home / Self Care | Payer: BLUE CROSS/BLUE SHIELD | Source: Ambulatory Visit | Attending: Family Medicine | Admitting: Family Medicine

## 2020-03-16 ENCOUNTER — Other Ambulatory Visit: Payer: Self-pay

## 2020-03-16 DIAGNOSIS — E0590 Thyrotoxicosis, unspecified without thyrotoxic crisis or storm: Secondary | ICD-10-CM

## 2021-05-14 DIAGNOSIS — H43813 Vitreous degeneration, bilateral: Secondary | ICD-10-CM | POA: Diagnosis not present

## 2021-05-14 DIAGNOSIS — H43393 Other vitreous opacities, bilateral: Secondary | ICD-10-CM | POA: Diagnosis not present

## 2021-05-14 DIAGNOSIS — H35413 Lattice degeneration of retina, bilateral: Secondary | ICD-10-CM | POA: Diagnosis not present

## 2021-05-14 DIAGNOSIS — H31093 Other chorioretinal scars, bilateral: Secondary | ICD-10-CM | POA: Diagnosis not present

## 2021-05-18 DIAGNOSIS — Z713 Dietary counseling and surveillance: Secondary | ICD-10-CM | POA: Diagnosis not present

## 2021-05-25 DIAGNOSIS — Z1231 Encounter for screening mammogram for malignant neoplasm of breast: Secondary | ICD-10-CM | POA: Diagnosis not present

## 2021-05-25 DIAGNOSIS — Z733 Stress, not elsewhere classified: Secondary | ICD-10-CM | POA: Diagnosis not present

## 2021-05-25 DIAGNOSIS — Z01419 Encounter for gynecological examination (general) (routine) without abnormal findings: Secondary | ICD-10-CM | POA: Diagnosis not present

## 2021-05-25 DIAGNOSIS — D252 Subserosal leiomyoma of uterus: Secondary | ICD-10-CM | POA: Diagnosis not present

## 2021-05-25 DIAGNOSIS — Z87442 Personal history of urinary calculi: Secondary | ICD-10-CM | POA: Diagnosis not present

## 2021-08-08 DIAGNOSIS — Z13220 Encounter for screening for lipoid disorders: Secondary | ICD-10-CM | POA: Diagnosis not present

## 2021-08-08 DIAGNOSIS — E0500 Thyrotoxicosis with diffuse goiter without thyrotoxic crisis or storm: Secondary | ICD-10-CM | POA: Diagnosis not present

## 2021-08-08 DIAGNOSIS — E049 Nontoxic goiter, unspecified: Secondary | ICD-10-CM | POA: Diagnosis not present

## 2021-08-08 DIAGNOSIS — H5713 Ocular pain, bilateral: Secondary | ICD-10-CM | POA: Diagnosis not present

## 2021-08-08 DIAGNOSIS — R7303 Prediabetes: Secondary | ICD-10-CM | POA: Diagnosis not present

## 2021-09-07 DIAGNOSIS — I10 Essential (primary) hypertension: Secondary | ICD-10-CM | POA: Diagnosis not present

## 2021-09-07 DIAGNOSIS — Z0000 Encounter for general adult medical examination without abnormal findings: Secondary | ICD-10-CM | POA: Diagnosis not present

## 2021-11-23 DIAGNOSIS — I10 Essential (primary) hypertension: Secondary | ICD-10-CM | POA: Diagnosis not present

## 2022-03-22 DIAGNOSIS — N921 Excessive and frequent menstruation with irregular cycle: Secondary | ICD-10-CM | POA: Diagnosis not present

## 2022-03-22 DIAGNOSIS — Z30431 Encounter for routine checking of intrauterine contraceptive device: Secondary | ICD-10-CM | POA: Diagnosis not present

## 2022-05-01 DIAGNOSIS — H43393 Other vitreous opacities, bilateral: Secondary | ICD-10-CM | POA: Diagnosis not present

## 2022-05-22 ENCOUNTER — Other Ambulatory Visit: Payer: Self-pay | Admitting: Obstetrics and Gynecology

## 2022-05-22 DIAGNOSIS — D259 Leiomyoma of uterus, unspecified: Secondary | ICD-10-CM | POA: Diagnosis not present

## 2022-05-22 DIAGNOSIS — N921 Excessive and frequent menstruation with irregular cycle: Secondary | ICD-10-CM | POA: Diagnosis not present

## 2022-05-22 DIAGNOSIS — Z01419 Encounter for gynecological examination (general) (routine) without abnormal findings: Secondary | ICD-10-CM | POA: Diagnosis not present

## 2022-05-22 DIAGNOSIS — N72 Inflammatory disease of cervix uteri: Secondary | ICD-10-CM | POA: Diagnosis not present

## 2022-05-22 DIAGNOSIS — Z30431 Encounter for routine checking of intrauterine contraceptive device: Secondary | ICD-10-CM | POA: Diagnosis not present

## 2022-05-28 DIAGNOSIS — I10 Essential (primary) hypertension: Secondary | ICD-10-CM | POA: Diagnosis not present

## 2022-05-28 DIAGNOSIS — R41840 Attention and concentration deficit: Secondary | ICD-10-CM | POA: Diagnosis not present

## 2022-05-28 DIAGNOSIS — F419 Anxiety disorder, unspecified: Secondary | ICD-10-CM | POA: Diagnosis not present

## 2022-05-28 DIAGNOSIS — R7303 Prediabetes: Secondary | ICD-10-CM | POA: Diagnosis not present

## 2022-06-20 DIAGNOSIS — Z1231 Encounter for screening mammogram for malignant neoplasm of breast: Secondary | ICD-10-CM | POA: Diagnosis not present

## 2022-06-20 DIAGNOSIS — Z124 Encounter for screening for malignant neoplasm of cervix: Secondary | ICD-10-CM | POA: Diagnosis not present

## 2022-07-19 ENCOUNTER — Other Ambulatory Visit: Payer: Self-pay | Admitting: Obstetrics and Gynecology

## 2022-07-19 DIAGNOSIS — N6310 Unspecified lump in the right breast, unspecified quadrant: Secondary | ICD-10-CM

## 2022-07-27 ENCOUNTER — Other Ambulatory Visit: Payer: BC Managed Care – PPO

## 2022-08-08 ENCOUNTER — Other Ambulatory Visit: Payer: BC Managed Care – PPO

## 2022-08-30 ENCOUNTER — Ambulatory Visit
Admission: RE | Admit: 2022-08-30 | Discharge: 2022-08-30 | Disposition: A | Discharge status: Home / Self Care | Payer: BC Managed Care – PPO | Source: Ambulatory Visit | Attending: Obstetrics and Gynecology | Admitting: Obstetrics and Gynecology

## 2022-08-30 DIAGNOSIS — R928 Other abnormal and inconclusive findings on diagnostic imaging of breast: Secondary | ICD-10-CM | POA: Diagnosis not present

## 2022-08-30 DIAGNOSIS — N6310 Unspecified lump in the right breast, unspecified quadrant: Secondary | ICD-10-CM

## 2022-10-25 DIAGNOSIS — F520 Hypoactive sexual desire disorder: Secondary | ICD-10-CM | POA: Diagnosis not present

## 2022-10-25 DIAGNOSIS — R7303 Prediabetes: Secondary | ICD-10-CM | POA: Diagnosis not present

## 2023-01-27 DIAGNOSIS — Z79899 Other long term (current) drug therapy: Secondary | ICD-10-CM | POA: Diagnosis not present

## 2023-01-27 DIAGNOSIS — R41840 Attention and concentration deficit: Secondary | ICD-10-CM | POA: Diagnosis not present

## 2023-01-28 DIAGNOSIS — Z79899 Other long term (current) drug therapy: Secondary | ICD-10-CM | POA: Diagnosis not present

## 2023-01-28 DIAGNOSIS — R41840 Attention and concentration deficit: Secondary | ICD-10-CM | POA: Diagnosis not present

## 2023-02-03 DIAGNOSIS — I10 Essential (primary) hypertension: Secondary | ICD-10-CM | POA: Diagnosis not present

## 2023-02-03 DIAGNOSIS — F419 Anxiety disorder, unspecified: Secondary | ICD-10-CM | POA: Diagnosis not present

## 2023-02-07 DIAGNOSIS — E049 Nontoxic goiter, unspecified: Secondary | ICD-10-CM | POA: Diagnosis not present

## 2023-02-07 DIAGNOSIS — E0500 Thyrotoxicosis with diffuse goiter without thyrotoxic crisis or storm: Secondary | ICD-10-CM | POA: Diagnosis not present

## 2023-02-24 DIAGNOSIS — Z79899 Other long term (current) drug therapy: Secondary | ICD-10-CM | POA: Diagnosis not present

## 2023-02-24 DIAGNOSIS — F902 Attention-deficit hyperactivity disorder, combined type: Secondary | ICD-10-CM | POA: Diagnosis not present

## 2023-03-06 DIAGNOSIS — F4323 Adjustment disorder with mixed anxiety and depressed mood: Secondary | ICD-10-CM | POA: Diagnosis not present

## 2023-03-06 DIAGNOSIS — R102 Pelvic and perineal pain: Secondary | ICD-10-CM | POA: Diagnosis not present

## 2023-03-06 DIAGNOSIS — I10 Essential (primary) hypertension: Secondary | ICD-10-CM | POA: Diagnosis not present

## 2023-03-07 DIAGNOSIS — F902 Attention-deficit hyperactivity disorder, combined type: Secondary | ICD-10-CM | POA: Diagnosis not present

## 2023-03-07 DIAGNOSIS — Z79899 Other long term (current) drug therapy: Secondary | ICD-10-CM | POA: Diagnosis not present

## 2023-03-21 DIAGNOSIS — R8290 Unspecified abnormal findings in urine: Secondary | ICD-10-CM | POA: Diagnosis not present

## 2023-03-25 DIAGNOSIS — R102 Pelvic and perineal pain: Secondary | ICD-10-CM | POA: Diagnosis not present

## 2023-04-21 DIAGNOSIS — N200 Calculus of kidney: Secondary | ICD-10-CM | POA: Diagnosis not present

## 2023-04-29 DIAGNOSIS — R7303 Prediabetes: Secondary | ICD-10-CM | POA: Diagnosis not present

## 2023-04-29 DIAGNOSIS — L509 Urticaria, unspecified: Secondary | ICD-10-CM | POA: Diagnosis not present

## 2023-04-29 DIAGNOSIS — M2560 Stiffness of unspecified joint, not elsewhere classified: Secondary | ICD-10-CM | POA: Diagnosis not present

## 2023-04-29 DIAGNOSIS — F419 Anxiety disorder, unspecified: Secondary | ICD-10-CM | POA: Diagnosis not present

## 2023-04-29 DIAGNOSIS — I10 Essential (primary) hypertension: Secondary | ICD-10-CM | POA: Diagnosis not present

## 2023-04-29 DIAGNOSIS — E0500 Thyrotoxicosis with diffuse goiter without thyrotoxic crisis or storm: Secondary | ICD-10-CM | POA: Diagnosis not present

## 2024-02-26 ENCOUNTER — Ambulatory Visit: Attending: Physician Assistant | Admitting: Physical Therapy

## 2024-02-26 ENCOUNTER — Other Ambulatory Visit: Payer: Self-pay

## 2024-02-26 ENCOUNTER — Encounter: Payer: Self-pay | Admitting: Physical Therapy

## 2024-02-26 DIAGNOSIS — R279 Unspecified lack of coordination: Secondary | ICD-10-CM | POA: Diagnosis present

## 2024-02-26 DIAGNOSIS — M6281 Muscle weakness (generalized): Secondary | ICD-10-CM | POA: Diagnosis present

## 2024-02-26 DIAGNOSIS — R293 Abnormal posture: Secondary | ICD-10-CM | POA: Diagnosis present

## 2024-02-26 NOTE — Therapy (Signed)
 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   Mitral valvular regurgitation: Mild-moderate 06/09/2014   Pre-eclamptic toxemia 06/08/2014   Preeclampsia in postpartum period 06/06/2014   Second-degree perineal laceration, with delivery 05/29/2014   Velamentous insertion of umbilical cord 05/29/2014   SVD (spontaneous vaginal delivery) 05/29/2014   Fibroids, subserous - measuring 2x2 cm in left anterior corpus 11/01/2013   Subchorionic hemorrhage in first trimester 11/01/2013   Ventral hernia 09/04/2011    PCP: Camille Galloway, MD  REFERRING PROVIDER: Christoper Tawni DEL, PA   REFERRING DIAG: [phone] (ICD-10-CM) - Other specified disorders of muscle  THERAPY DIAG:  Muscle  weakness (generalized)  Unspecified lack of coordination  Abnormal posture  Rationale for Evaluation and Treatment: Rehabilitation  ONSET DATE: End of June 2025   SUBJECTIVE:                                                                                                                                                                                           SUBJECTIVE STATEMENT: From eval: Patient reports to PFPT s/p partial hysterectomy at the end of June - had a great experience. At the time, she considered PFPT. She decided to wait it out and now she is experiencing urinary  urgency - this will cause dribbling before she is able to to make it to the toilet. She has a hx of uterine fibroids and endometriosis. Fluid intake: tries to increase water intake, 2-3 40 oz; coffee (1x/morning, 2-3 cups sometimes, sometimes does a cup in the afternoon, decaf in the evening)   FUNCTIONAL LIMITATIONS: getting to the bathroom on time with a full bladder   PERTINENT HISTORY:  Medications for current condition: macrobid , progesterone, estradiol  Surgeries: partial hysterectomy in June  Other: hx of graves disease, uterine fibroids, endometriosis  Sexual abuse: No  PAIN:  Are you having pain? No NPRS scale: 0/10  PRECAUTIONS: None  RED FLAGS: None   WEIGHT BEARING RESTRICTIONS: No  FALLS:  Has patient fallen in last 6 months? No  OCCUPATION: works for [employer] - works full time seated majority of day   ACTIVITY LEVEL : tries to walk as much as she can   PLOF: Independent with basic ADLs  PATIENT GOALS: improve urinary urgency and be able to make it to the bathroom on time   BOWEL MOVEMENT: no issues  Pain with bowel movement: No Type of bowel movement:Type (Bristol Stool Scale) 4, Frequency within normal limits , Strain no, and Splinting no Fully empty rectum: Yes:   Leakage: No                                                     Caused by:   Pads: No Fiber supplement/laxative No  URINATION: Pain with urination: No Fully empty bladder: Yes:                                  Post-void dribble: No Stream: Strong Urgency: Yes  Frequency:during the day within normal limits                                                          Nocturia: No   Leakage: none Pads/briefs: Yes: pantiliners due to fear of leakage   INTERCOURSE:  Ability to have vaginal penetration Yes  Pain with intercourse: none Dryness: No Climax: yes Marinoff Scale: 0/3 Lubricant: yes  PREGNANCY:  Vaginal deliveries 2 Tearing Yes: with the first delivery  Episiotomy No C-section deliveries 0 Currently pregnant No  PROLAPSE: None   OBJECTIVE:  Note: Objective measures were completed at Evaluation unless otherwise noted.  PATIENT SURVEYS:  PFIQ-7: 45  COGNITION: Overall cognitive status: Within functional limits for tasks assessed     SENSATION: Light touch: Appears intact  LUMBAR SPECIAL TESTS:  Straight leg raise test: Positive  FUNCTIONAL TESTS:  Single leg stance:  Rt: +  Lt: + Sit-up test: 1/3  Squat: within normal limits  Bed mobility: within functional limits   GAIT: Assistive device utilized: None Comments: mild trendelenburg gait pattern with ambulation   POSTURE: rounded shoulders  LUMBARAROM/PROM: within normal limits for all motions bilaterally with no pain   LOWER EXTREMITY ROM: within normal limits for all motions bilaterally with no pain   LOWER EXTREMITY MMT: 4/5 bilateral knees and hips grossly with no pain  PALPATION:  General: no tenderness to palpation of bilateral hip flexors or adductors in supine   Pelvic Alignment: within normal limits   Abdominal: abdominal bracing at rest   Diastasis: No Distortion: No  Breathing: apical breathing pattern with decreased lower rib excursion with inhalation  Scar tissue: Yes:                  External Perineal Exam: no dryness present, sufficient moisture  levels; adequate clitoral hood mobility with no tenderness to palpation externally                              Internal Pelvic Floor: Pt demonstrates normal tone in superficial and deep aspects of pelvic floor bilaterally with no palpable trigger points. She has a weak pelvic floor contraction that improves in strength and coordination when paired with an exhale. There is a lack of coordination between the diaphragm and pelvic floor musculature and pt tends to breathe in her upper chest. When cued to lengthen her pelvic floor with inhalation and shorten her pelvic floor with exhalation, pt increased her overall range of motion with her pelvic floor contraction. Endurance testing is weak and she plans to practice quick flick testing at home. No pain during or following today's examination.   Patient confirms identification and approves PT to assess internal pelvic floor and treatment Yes No emotional/communication barriers or cognitive limitation. Patient is motivated to learn. Patient understands and agrees with treatment goals and plan. PT explains patient will be examined in standing, sitting, and lying down to see how their muscles and joints work. When they are ready, they will be asked to remove their underwear so PT can examine their perineum. The patient is also given the option of providing their own chaperone as one is not provided in our facility. The patient also has the right and is explained the right to defer or refuse any part of the evaluation or treatment including the internal exam. With the patient's consent, PT will use one gloved finger to gently assess the muscles of the pelvic floor, seeing how well it contracts and relaxes and if there is muscle symmetry. After, the patient will get dressed and PT and patient will discuss exam findings and plan of care. PT and patient discuss plan of care, schedule, attendance policy and HEP activities.  PELVIC MMT:   MMT eval  Vaginal 2/5, 10  quick flicks with difficulty, 4 sec hold  Internal Anal Sphincter   External Anal Sphincter   Puborectalis   Diastasis Recti   (Blank rows = not tested)       TONE: Within normal limits bilaterally   PROLAPSE: None present   TODAY'S TREATMENT:                                                                                                                              DATE:  EVAL 02/26/24: Examination completed, findings reviewed, pt educated on POC, HEP, and self care. Pt motivated to participate in PT and agreeable to attempt recommendations.   Hooklying pelvic floor contraction + diaphragmatic breathing 2x10  Hooklying quick flick pelvic floor contraction + diaphragmatic breathing 2x10   PATIENT EDUCATION:  Education details: relative anatomy and the connection between the diaphragm and pelvic floor, water intake Person educated: Patient Education method: Explanation, Demonstration, Tactile cues, Verbal cues, and Handouts Education comprehension: verbalized understanding, returned demonstration, verbal cues required, tactile cues required, and needs further education  HOME EXERCISE PROGRAM: Access Code: [email or web address] URL: [email or web address] Date: 02/26/2024 Prepared by: Celena Domino  Exercises - Supine Pelvic Floor Contraction  - 1 x daily - 7 x weekly - 3 sets - 10 reps - Quick Flick Pelvic Floor Contractions in Hooklying  - 1 x daily - 7 x weekly - 3 sets - 10 reps  Patient Education - Get To Know Your Pelvic Floor- Female  ASSESSMENT:  CLINICAL IMPRESSION: Patient is a 43 y.o. female  who was seen today for physical therapy evaluation and treatment for pelvic floor dysfunction following partial hysterectomy in June of this year. At the time, she considered PFPT. She decided to wait it out and now she is experiencing urinary urgency - this will cause dribbling before she is able to to make it to the toilet. She has a hx of uterine fibroids and endometriosis. Pt  demonstrates normal tone in superficial and deep aspects of pelvic floor bilaterally with no palpable trigger points. She has a weak pelvic floor contraction that improves in strength and coordination when paired with an exhale. There is a lack of coordination between the diaphragm and pelvic floor musculature and pt tends to breathe in her upper chest. When cued to lengthen her pelvic floor with inhalation and shorten her pelvic floor with exhalation, pt increased her overall range of motion with her pelvic floor contraction. Endurance testing is weak and she plans to practice quick flick testing at home. No pain during or following today's examination. Overall, pt tolerated session well and Pt would benefit from additional PT to further address deficits.    OBJECTIVE IMPAIRMENTS: decreased coordination, decreased endurance, decreased mobility, decreased ROM, and decreased strength.   ACTIVITY LIMITATIONS: continence and toileting  PARTICIPATION LIMITATIONS: community activity and occupation  PERSONAL FACTORS: Age, Past/current experiences, and Time since onset of injury/illness/exacerbation are also affecting patient's functional outcome.   REHAB POTENTIAL: Excellent  CLINICAL DECISION MAKING: Stable/uncomplicated  EVALUATION COMPLEXITY: Low   GOALS: Goals reviewed with patient? Yes  SHORT TERM GOALS: Target date: 03/25/2024  Pt will be independent with HEP.  Baseline: Goal status: INITIAL  2.  Pt will be independent with use of squatty potty, relaxed toileting mechanics, and improved bowel movement techniques in order to increase ease of bowel movements and complete evacuation.  Baseline:  Goal status: INITIAL  3.  Pt will be independent with the knack, urge suppression technique, and double voiding in order to improve bladder habits and decrease urinary incontinence.  Baseline:  Goal status: INITIAL  4.  Pt will be able to teach back and utilize urge suppression technique in  order to help pt make it to the bathroom with a high sense of urgency while at work.   Baseline:  Goal status: INITIAL  LONG TERM GOALS: Target date: 08/26/2024  Pt will be independent with advanced HEP.  Baseline:  Goal status: INITIAL  2.  Pt to demonstrate improved coordination of pelvic floor  and breathing mechanics with 10# squat with appropriate synergistic patterns to decrease pain and leakage at least 75% of the time for improved ability to complete a 30 minute workout with strain at pelvic floor and symptoms.   Baseline:  Goal status: INITIAL  3.  Pt will demonstrate normal pelvic floor muscle tone and A/ROM, able to achieve 3/5 strength with contractions and 10 sec endurance, in order to reduce urinary leaking and number of pads patient wears.   Baseline:  Goal status: INITIAL  PLAN:  PT FREQUENCY: 1-2x/week  PT DURATION: 6 months  PLANNED INTERVENTIONS: 97110-Therapeutic exercises, 97530- Therapeutic activity, 97112- Neuromuscular re-education, 97535- Self Care, [id number]- Manual therapy, Patient/Family education, Taping, Joint mobilization, Spinal mobilization, Scar mobilization, Cryotherapy, Moist heat, and Biofeedback  PLAN FOR NEXT SESSION: continued pelvic floor AROM in seated, discuss toileting mechanics and the double voiding technique, core and hip strengthening   Celena JAYSON Domino, PT 02/26/2024, 8:39 AM [facility] [address], [address] [place], [place] [address] Phone # [phone] Fax [phone]

## 2024-04-20 ENCOUNTER — Ambulatory Visit: Attending: Physician Assistant | Admitting: Physical Therapy
